# Patient Record
Sex: Male | Born: 1955 | Race: White | Hispanic: No | Marital: Single | Smoking: Former smoker
Health system: Southern US, Community
[De-identification: ages and names within clinical notes are randomized; demographics above are authoritative.]

## PROBLEM LIST (undated history)

## (undated) ENCOUNTER — Ambulatory Visit: Disposition: A | Discharge status: Home / Self Care | Payer: Self-pay

## (undated) DIAGNOSIS — C140 Malignant neoplasm of pharynx, unspecified: Secondary | ICD-10-CM

## (undated) DIAGNOSIS — C801 Malignant (primary) neoplasm, unspecified: Secondary | ICD-10-CM

## (undated) DIAGNOSIS — K219 Gastro-esophageal reflux disease without esophagitis: Secondary | ICD-10-CM

## (undated) DIAGNOSIS — I10 Essential (primary) hypertension: Secondary | ICD-10-CM

## (undated) DIAGNOSIS — F419 Anxiety disorder, unspecified: Secondary | ICD-10-CM

## (undated) DIAGNOSIS — J45909 Unspecified asthma, uncomplicated: Secondary | ICD-10-CM

## (undated) HISTORY — DX: Gastro-esophageal reflux disease without esophagitis: K21.9

## (undated) HISTORY — DX: Anxiety disorder, unspecified: F41.9

## (undated) HISTORY — DX: Essential (primary) hypertension: I10

## (undated) HISTORY — DX: Unspecified asthma, uncomplicated: J45.909

---

## 2005-10-12 ENCOUNTER — Other Ambulatory Visit: Payer: Self-pay

## 2005-10-12 ENCOUNTER — Inpatient Hospital Stay: Payer: Self-pay | Admitting: Internal Medicine

## 2005-10-15 ENCOUNTER — Emergency Department: Payer: Self-pay | Admitting: General Practice

## 2007-05-27 ENCOUNTER — Other Ambulatory Visit: Payer: Self-pay

## 2007-05-28 ENCOUNTER — Observation Stay: Payer: Self-pay | Admitting: Internal Medicine

## 2007-05-31 ENCOUNTER — Ambulatory Visit: Payer: Self-pay | Admitting: Internal Medicine

## 2007-06-02 ENCOUNTER — Inpatient Hospital Stay: Payer: Self-pay | Admitting: Internal Medicine

## 2007-06-02 ENCOUNTER — Other Ambulatory Visit: Payer: Self-pay

## 2007-06-13 ENCOUNTER — Other Ambulatory Visit: Payer: Self-pay

## 2007-06-14 ENCOUNTER — Inpatient Hospital Stay: Payer: Self-pay | Admitting: Internal Medicine

## 2007-06-14 ENCOUNTER — Other Ambulatory Visit: Payer: Self-pay

## 2008-09-09 ENCOUNTER — Emergency Department: Payer: Self-pay | Admitting: Emergency Medicine
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
## 2009-01-29 ENCOUNTER — Emergency Department: Payer: Self-pay | Admitting: Unknown Physician Specialty

## 2009-09-20 ENCOUNTER — Emergency Department: Payer: Self-pay | Admitting: Internal Medicine

## 2010-04-18 ENCOUNTER — Emergency Department: Payer: Self-pay | Admitting: Emergency Medicine

## 2010-11-10 ENCOUNTER — Emergency Department: Payer: Self-pay | Admitting: Emergency Medicine

## 2011-05-06 ENCOUNTER — Emergency Department: Payer: Self-pay | Admitting: Internal Medicine

## 2011-05-06 LAB — COMPREHENSIVE METABOLIC PANEL
Alkaline Phosphatase: 65 U/L (ref 50–136)
Anion Gap: 7 (ref 7–16)
Bilirubin,Total: 0.4 mg/dL (ref 0.2–1.0)
Calcium, Total: 8.4 mg/dL — ABNORMAL LOW (ref 8.5–10.1)
Chloride: 105 mmol/L (ref 98–107)
Co2: 30 mmol/L (ref 21–32)
Creatinine: 0.89 mg/dL (ref 0.60–1.30)
EGFR (African American): >60
EGFR (Non-African Amer.): >60
Glucose: 103 mg/dL — ABNORMAL HIGH (ref 65–99)
Potassium: 4 mmol/L (ref 3.5–5.1)
SGPT (ALT): 35 U/L
Total Protein: 7.4 g/dL (ref 6.4–8.2)

## 2011-05-06 LAB — URINALYSIS, COMPLETE
Bacteria: NONE SEEN
Bilirubin,UR: NEGATIVE
Glucose,UR: NEGATIVE mg/dL (ref 0–75)
Leukocyte Esterase: NEGATIVE
Nitrite: NEGATIVE
RBC,UR: 1 /HPF (ref 0–5)
Specific Gravity: 1.023 (ref 1.003–1.030)
Squamous Epithelial: <1

## 2011-05-06 LAB — CBC
HGB: 16 g/dL (ref 13.0–18.0)
MCH: 32.1 pg (ref 26.0–34.0)
MCHC: 34.1 g/dL (ref 32.0–36.0)
MCV: 94 fL (ref 80–100)
Platelet: 188 10*3/uL (ref 150–440)
RBC: 5 10*6/uL (ref 4.40–5.90)

## 2011-05-06 LAB — DRUG SCREEN, URINE
Barbiturates, Ur Screen: NEGATIVE (ref ?–200)
Benzodiazepine, Ur Scrn: NEGATIVE (ref ?–200)
Cannabinoid 50 Ng, Ur ~~LOC~~: NEGATIVE (ref ?–50)

## 2011-05-06 LAB — RAPID INFLUENZA A&B ANTIGENS

## 2011-05-06 LAB — CK TOTAL AND CKMB (NOT AT ARMC): CK-MB: <0.5 ng/mL — ABNORMAL LOW (ref 0.5–3.6)

## 2011-05-06 LAB — TROPONIN I: Troponin-I: <0.02 ng/mL

## 2011-05-06 LAB — LIPASE, BLOOD: Lipase: 119 U/L (ref 73–393)

## 2011-05-12 LAB — CULTURE, BLOOD (SINGLE)

## 2011-07-22 DIAGNOSIS — K219 Gastro-esophageal reflux disease without esophagitis: Secondary | ICD-10-CM | POA: Insufficient documentation

## 2011-07-22 DIAGNOSIS — I10 Essential (primary) hypertension: Secondary | ICD-10-CM | POA: Diagnosis present

## 2011-10-05 ENCOUNTER — Emergency Department: Payer: Self-pay | Admitting: Unknown Physician Specialty

## 2011-10-05 LAB — CBC
HCT: 45.6 % (ref 40.0–52.0)
MCH: 31.7 pg (ref 26.0–34.0)
MCHC: 33.4 g/dL (ref 32.0–36.0)
Platelet: 190 10*3/uL (ref 150–440)
RDW: 13.1 % (ref 11.5–14.5)

## 2011-10-05 LAB — COMPREHENSIVE METABOLIC PANEL
Albumin: 3.7 g/dL (ref 3.4–5.0)
BUN: 16 mg/dL (ref 7–18)
Bilirubin,Total: 0.5 mg/dL (ref 0.2–1.0)
Chloride: 110 mmol/L — ABNORMAL HIGH (ref 98–107)
Co2: 26 mmol/L (ref 21–32)
Creatinine: 0.93 mg/dL (ref 0.60–1.30)
EGFR (Non-African Amer.): >60
Glucose: 112 mg/dL — ABNORMAL HIGH (ref 65–99)
Osmolality: 287 (ref 275–301)
Potassium: 3.7 mmol/L (ref 3.5–5.1)
SGPT (ALT): 32 U/L
Sodium: 143 mmol/L (ref 136–145)

## 2011-10-05 LAB — CK TOTAL AND CKMB (NOT AT ARMC): CK, Total: 90 U/L (ref 35–232)

## 2011-10-05 LAB — MAGNESIUM: Magnesium: 2 mg/dL

## 2011-10-13 DIAGNOSIS — I2510 Atherosclerotic heart disease of native coronary artery without angina pectoris: Secondary | ICD-10-CM | POA: Diagnosis present

## 2011-11-29 ENCOUNTER — Emergency Department: Payer: Self-pay | Admitting: Emergency Medicine

## 2011-11-29 LAB — TROPONIN I: Troponin-I: <0.02 ng/mL

## 2011-11-29 LAB — BASIC METABOLIC PANEL
Anion Gap: 7 (ref 7–16)
BUN: 14 mg/dL (ref 7–18)
Calcium, Total: 8 mg/dL — ABNORMAL LOW (ref 8.5–10.1)
Chloride: 110 mmol/L — ABNORMAL HIGH (ref 98–107)
Co2: 27 mmol/L (ref 21–32)
Creatinine: 0.94 mg/dL (ref 0.60–1.30)
Osmolality: 289 (ref 275–301)
Potassium: 3.7 mmol/L (ref 3.5–5.1)

## 2011-11-29 LAB — CBC
HCT: 43.2 % (ref 40.0–52.0)
MCHC: 35.4 g/dL (ref 32.0–36.0)
MCV: 93 fL (ref 80–100)
Platelet: 188 10*3/uL (ref 150–440)
RBC: 4.65 10*6/uL (ref 4.40–5.90)
RDW: 13.3 % (ref 11.5–14.5)
WBC: 5.9 10*3/uL (ref 3.8–10.6)

## 2011-11-29 LAB — CK TOTAL AND CKMB (NOT AT ARMC): CK, Total: 232 U/L (ref 35–232)

## 2012-09-08 ENCOUNTER — Emergency Department: Payer: Self-pay | Admitting: Unknown Physician Specialty

## 2012-09-08 LAB — COMPREHENSIVE METABOLIC PANEL
Albumin: 4 g/dL (ref 3.4–5.0)
Alkaline Phosphatase: 111 U/L (ref 50–136)
Bilirubin,Total: 0.4 mg/dL (ref 0.2–1.0)
Calcium, Total: 9 mg/dL (ref 8.5–10.1)
Co2: 31 mmol/L (ref 21–32)
EGFR (African American): >60
EGFR (Non-African Amer.): >60
Osmolality: 279 (ref 275–301)
Potassium: 4.1 mmol/L (ref 3.5–5.1)
SGPT (ALT): 48 U/L (ref 12–78)
Sodium: 140 mmol/L (ref 136–145)
Total Protein: 8.3 g/dL — ABNORMAL HIGH (ref 6.4–8.2)

## 2012-09-08 LAB — CBC
HGB: 17.6 g/dL (ref 13.0–18.0)
MCH: 32.7 pg (ref 26.0–34.0)
MCHC: 35.3 g/dL (ref 32.0–36.0)
MCV: 93 fL (ref 80–100)
Platelet: 212 10*3/uL (ref 150–440)
RBC: 5.37 10*6/uL (ref 4.40–5.90)
RDW: 13.6 % (ref 11.5–14.5)

## 2012-09-08 LAB — TROPONIN I: Troponin-I: <0.02 ng/mL

## 2012-09-08 LAB — MAGNESIUM: Magnesium: 2.1 mg/dL

## 2012-11-28 ENCOUNTER — Emergency Department: Payer: Self-pay | Admitting: Emergency Medicine

## 2012-12-07 ENCOUNTER — Emergency Department: Payer: Self-pay | Admitting: Emergency Medicine

## 2012-12-07 LAB — COMPREHENSIVE METABOLIC PANEL
Albumin: 3.7 g/dL (ref 3.4–5.0)
Alkaline Phosphatase: 102 U/L (ref 50–136)
Anion Gap: 4 — ABNORMAL LOW (ref 7–16)
BUN: 19 mg/dL — ABNORMAL HIGH (ref 7–18)
Bilirubin,Total: 0.2 mg/dL (ref 0.2–1.0)
Calcium, Total: 9 mg/dL (ref 8.5–10.1)
Chloride: 106 mmol/L (ref 98–107)
Co2: 29 mmol/L (ref 21–32)
Creatinine: 0.98 mg/dL (ref 0.60–1.30)
EGFR (Non-African Amer.): >60
Glucose: 115 mg/dL — ABNORMAL HIGH (ref 65–99)
Osmolality: 281 (ref 275–301)
Potassium: 4 mmol/L (ref 3.5–5.1)
SGOT(AST): 30 U/L (ref 15–37)
Sodium: 139 mmol/L (ref 136–145)

## 2012-12-07 LAB — CBC
HCT: 43.6 % (ref 40.0–52.0)
HGB: 15.3 g/dL (ref 13.0–18.0)
MCH: 32.2 pg (ref 26.0–34.0)
RBC: 4.77 10*6/uL (ref 4.40–5.90)
RDW: 12.9 % (ref 11.5–14.5)

## 2012-12-07 LAB — CK TOTAL AND CKMB (NOT AT ARMC): CK, Total: 162 U/L (ref 35–232)

## 2012-12-07 LAB — TROPONIN I: Troponin-I: <0.02 ng/mL

## 2012-12-08 LAB — TROPONIN I: Troponin-I: <0.02 ng/mL

## 2012-12-08 LAB — URINALYSIS, COMPLETE
Bilirubin,UR: NEGATIVE
Blood: NEGATIVE
Glucose,UR: NEGATIVE mg/dL (ref 0–75)
Ketone: NEGATIVE
Leukocyte Esterase: NEGATIVE
Protein: NEGATIVE
RBC,UR: NONE SEEN /HPF (ref 0–5)
Specific Gravity: 1.025 (ref 1.003–1.030)
Squamous Epithelial: NONE SEEN
WBC UR: <1 /HPF (ref 0–5)

## 2013-03-02 ENCOUNTER — Emergency Department: Payer: Self-pay | Admitting: Emergency Medicine

## 2013-03-07 DIAGNOSIS — E119 Type 2 diabetes mellitus without complications: Secondary | ICD-10-CM

## 2013-06-05 ENCOUNTER — Observation Stay: Payer: Self-pay | Admitting: Internal Medicine

## 2013-06-05 LAB — BASIC METABOLIC PANEL
BUN: 15 mg/dL (ref 7–18)
CALCIUM: 8.4 mg/dL — AB (ref 8.5–10.1)
Chloride: 111 mmol/L — ABNORMAL HIGH (ref 98–107)
Co2: 33 mmol/L — ABNORMAL HIGH (ref 21–32)
Creatinine: 1.01 mg/dL (ref 0.60–1.30)
EGFR (African American): >60
EGFR (Non-African Amer.): >60
GLUCOSE: 110 mg/dL — AB (ref 65–99)
Osmolality: 279 (ref 275–301)
Potassium: 3.6 mmol/L (ref 3.5–5.1)
Sodium: 139 mmol/L (ref 136–145)

## 2013-06-05 LAB — CBC
HCT: 44.5 % (ref 40.0–52.0)
HGB: 15.2 g/dL (ref 13.0–18.0)
MCH: 32.1 pg (ref 26.0–34.0)
MCHC: 34.2 g/dL (ref 32.0–36.0)
MCV: 94 fL (ref 80–100)
PLATELETS: 191 10*3/uL (ref 150–440)
RBC: 4.74 10*6/uL (ref 4.40–5.90)
RDW: 13.4 % (ref 11.5–14.5)
WBC: 8.8 10*3/uL (ref 3.8–10.6)

## 2013-06-05 LAB — CK TOTAL AND CKMB (NOT AT ARMC)
CK, Total: 375 U/L — ABNORMAL HIGH
CK, Total: 295 U/L
CK, Total: 260 U/L
CK-MB: 5.7 ng/mL — AB (ref 0.5–3.6)
CK-MB: 4.4 ng/mL — ABNORMAL HIGH (ref 0.5–3.6)
CK-MB: 4.5 ng/mL — ABNORMAL HIGH (ref 0.5–3.6)

## 2013-06-05 LAB — PROTIME-INR
INR: 1
Prothrombin Time: 13 secs (ref 11.5–14.7)

## 2013-06-05 LAB — PRO B NATRIURETIC PEPTIDE: B-TYPE NATIURETIC PEPTID: 80 pg/mL (ref 0–125)

## 2013-06-05 LAB — TROPONIN I: Troponin-I: <0.02 ng/mL

## 2013-06-06 LAB — LIPID PANEL
Cholesterol: 145 mg/dL (ref 0–200)
HDL Cholesterol: 28 mg/dL — ABNORMAL LOW (ref 40–60)
Ldl Cholesterol, Calc: 65 mg/dL (ref 0–100)
Triglycerides: 262 mg/dL — ABNORMAL HIGH (ref 0–200)
VLDL Cholesterol, Calc: 52 mg/dL — ABNORMAL HIGH (ref 5–40)

## 2013-06-06 LAB — HEMOGLOBIN A1C: Hemoglobin A1C: 5.5 % (ref 4.2–6.3)

## 2013-08-26 DIAGNOSIS — E041 Nontoxic single thyroid nodule: Secondary | ICD-10-CM | POA: Insufficient documentation

## 2014-08-14 NOTE — Consult Note (Signed)
PATIENT NAME:  Carl Waters, Carl Waters MR#:  [id number] DATE OF BIRTH:  [birth year]  DATE OF CONSULTATION:  11/29/2011  REFERRING PHYSICIAN:  Daryel November, MD  CONSULTING PHYSICIAN:  Krystal Eaton, MD  PRIMARY CARE PHYSICIAN: Bernestine Amass   PRIMARY CARDIOLOGIST: [facility]    CHIEF COMPLAINT: Abdominal pain and chest pain.   HISTORY OF PRESENT ILLNESS: The patient is a pleasant 59 year old Caucasian male with history of coronary artery disease status post cath and stenting to left anterior descending lesion in 2009 as well as some RCA lesions, history of tobacco abuse, hypertension, and hyperlipidemia who was at church this morning and experienced chest pain in the left side of the chest with some abdominal pain. The pain in the chest was nonradiating. He did experience some diaphoresis and weakness without any dizziness or visual changes. He also experienced some abdominal pain at the same time more in the epigastric area. He is not having any fevers, chills, nausea, or vomiting. He denies having any cough or shortness of breath. Here in the ER he has a negative troponin as well as a negative CK-MB. Of note, the patient also experienced another episode of similar symptoms about a month ago he states. At that time his cardiologist took him to the Cath Lab at [facility] and he states "they said I was fine". While in church, EMS was called and while in the ambulance he did have two nitroglycerin sublingual. At this point he does not have any chest pain. Hospitalist service was contacted for further evaluation.   The patient also has been noncompliant with his medications. He says he ran out of his medications for about 3 to 4 days. He tried to [employer] as he has no source of income or drop to pay for his medications, however, has been unsuccessful in getting his medications. He states he is on aspirin and nitroglycerin sublingual as well as a couple of blood pressure medicines and possibly  simvastatin.   PAST MEDICAL HISTORY:  1. Coronary artery disease status post stenting as above with EF of 60% per chart in 2009. 2. History of alcohol abuse, now occasional alcohol.  3. History of tobacco abuse, currently has stopped as of 2009.  4. Hypertension.  5. Hyperlipidemia.   ALLERGIES: No known drug allergies.   MEDICATIONS:  1. Aspirin 81 mg daily. 2. Nitroglycerin sublingual p.r.n.   The rest of his medications he does not know the dosages or the names and we could not call the pharmacy as the pharmacy was closed. However, he states that he takes a couple of blood pressure medications and a cholesterol medicine.   SOCIAL HISTORY: Previous tobacco abuser, currently quit as of 2009. Occasional alcohol. No drug use.   FAMILY HISTORY: Dad with history of what he describes as could be possibly an aneurysm. Cancer in multiple family members.   REVIEW OF SYSTEMS: CONSTITUTIONAL: No fever or fatigue. Some global weakness. No weight changes. EYES: No blurry vision or double vision. Has right eye subconjunctival hemorrhage which is being treated as an outpatient. ENT: No tinnitus or hearing loss. RESPIRATORY: No cough. Has some chronic shortness of breath. No asthma or COPD. No painful respiration. CARDIOVASCULAR: Chest pain as above. No orthopnea or edema. Has history of high blood pressure. No palpitations or syncope. GI: No nausea, vomiting, or diarrhea. Denies constipation. Denies hematemesis or melena. Has abdominal pain in the epigastric area. GU: Denies dysuria, incontinence, or frequency. ENDOCRINE: Denies polyuria or nocturia. HEME/LYMPH: Denies anemia  or easy bruising. SKIN: Denies any new rashes. MUSCULOSKELETAL: Denies arthritis or gout. NEUROLOGIC: Denies any focal weakness or CVA. PSYCHIATRIC: Denies depression or anxiety.   PHYSICAL EXAMINATION:   VITAL SIGNS: Temperature on arrival 98.2, pulse rate 88, respiratory rate 18, initial blood pressure 136/73, last one 102/78,  oxygen sat 97% on room air.   GENERAL: The patient is a pleasant Caucasian male sitting in bed in no obvious distress talking in full sentences.   HEENT: Normocephalic, atraumatic. Pupils are equal and reactive. There is right conjunctival hemorrhage. Extraocular muscles intact. Moist mucous membranes.   NECK: Supple. No thyroid tenderness. No JVD.   CARDIOVASCULAR: S1, S2 regular rate and rhythm. No murmurs, rubs, or gallops.   LUNGS: Clear to auscultation bilaterally without wheezing or rhonchi.   ABDOMEN: Soft, nondistended. Mild epigastric tenderness. No rebound or guarding.   EXTREMITIES: No significant lower extremity edema.   SKIN: No obvious rashes.   NEUROLOGIC: Cranial nerves II through XII grossly intact. Strength is 5/5 in all extremities.   PSYCH: Awake, alert, oriented x3, pleasant and cooperative.   LABORATORY, DIAGNOSTIC, AND RADIOLOGICAL DATA: Glucose 128, creatinine 0.94, BUN 14, sodium 144, potassium 3.7. CK-MB 2.1. Troponin negative. CK total 232. WBC 5.9, hemoglobin 15.3, hematocrit 43.2.   X-ray of the abdomen, 3-way including chest PA, showing mild bibasilar opacity likely secondary to atelectasis. No free intraperitoneal air. Stool is seen with nondilated colon. There is relative paucity of small bowel gas limiting evaluation but no evidence of dilated loops of small bowel.   EKG normal sinus rhythm, rate is 87. No acute ST elevations or depression. There are nonspecific T wave changes.   ASSESSMENT AND PLAN: We have a 59 year old Caucasian male with hypertension, hyperlipidemia, coronary artery disease status post stenting back in 2009 who has also apparently had another cath about a month ago at [facility] which he states they told him that he did not need any changes to his medications and was told "he was fine". He presents with another episode of chest pain and abdominal pain. At this point he has a negative troponin and has no further chest pains. EKG did not show  any acute ST elevations or depressions but has some nonspecific T wave changes but given the fact that he just had a cardiac cath a month ago at [facility] I would think that this is likely not cardiac at this point. He's been noncompliant with his medications, which I discussed with him the importance of compliance. He is to continue taking his medications as an outpatient and we have discussed the case with our case management here who will provide some prescriptions for him until he sees his primary care physician at [facility].   In regards to his abdominal pain, upon further conversation with him he does appear to have GERD-related symptoms such as burning in his esophagus and throat when he lays flat, some bloating. I would recommend starting a proton pump inhibitor or an antacid, whatever he can afford, until he sees his primary care physician. There is also some stool seen in the x-ray and he might benefit from being on some stool softener. In regards to his chest pain, I would advise follow-up with his primary cardiologist and being compliant with his medications. Would not recommend any further cardiac work-up at this point given the recent cardiac cath at [facility].   TOTAL TIME SPENT: 50 minutes.   ____________________________ Krystal Eaton, MD sa:drc D: 11/29/2011 15:20:05 ET T: 11/30/2011 08:45:36 ET JOB#:  [id number]  cc: Krystal EatonShayiq Aarianna Hoadley, MD, <Dictator> Krystal EatonSHAYIQ Adith Tejada MD ELECTRONICALLY SIGNED 12/08/2011 15:18

## 2014-08-18 NOTE — Discharge Summary (Signed)
PATIENT NAME:  Carl Waters, Carl Waters MR#:  [id number] DATE OF BIRTH:  05/16/1955  DATE OF ADMISSION:  06/05/2013 DATE OF DISCHARGE:  06/06/2013  PRESENTING COMPLAINT: Chest pain and right shoulder pain.   DISCHARGE DIAGNOSES: 1.  Chest pain, appears atypical.  2.  Suspected right frozen shoulder.  3.  Coronary artery disease.  4.  Hypertension. Myoview stress test negative.   CODE STATUS: Full code.   MEDICATIONS: 1.  Aspirin 81 mg daily.  2.  Nitroglycerin 0.4 mg sublingual as needed.  3.  Amitriptyline 25 mg at bedtime.  4.  Cyclobenzaprine 10 mg every 8 hours as needed.  5.  Prilosec 20 mg p.o. daily.  6.  Lipitor 40 mg daily.   FOLLOWUP: [facility] in 2 to 4 weeks.   Cardiac enzymes x 3 negative. Myoview stress test within normal limits. Hemoglobin A1c is 5.5. CBC within normal limits.   BRIEF SUMMARY OF HOSPITAL COURSE: Joselyn ArrowJoseph Waters is a 59 year old, very pleasant Caucasian gentleman with a history of CAD. Comes to the Emergency Room after he started having some chest pain. He was admitted with:  1.  Chest pain, which appeared atypical: He was ruled out with 3 sets of cardiac enzymes and Myoview stress which was negative. He was continued on aspirin, statins, and lisinopril. Did not have any further chest pains and EKG did not show any changes.  2.  Suspected right frozen shoulder: The patient was asked to Tylenol or Advil. Exercises were explained to the patient, and a printout of exercises for frozen shoulder was given to the patient.  3.  Hypertension: Lisinopril was continued.  4.  CAD: The patient is on aspirin.   Overall hospital stay remained stable. The patient remained a full code.   TIME SPENT: 40 minutes.    ____________________________ Wylie HailSona A. Allena KatzPatel, MD sap:jcm D: 06/06/2013 17:59:04 ET T: 06/06/2013 20:41:41 ET JOB#: [id number]  cc: Abed Schar A. Allena KatzPatel, MD, <Dictator> [facility] Willow OraSONA A Torian Quintero MD ELECTRONICALLY SIGNED 06/08/2013 13:26

## 2014-08-18 NOTE — H&P (Signed)
PATIENT NAME:  Carl Waters, Carl Waters MR#:  [id number] DATE OF BIRTH:  Apr 29, 1955  DATE OF ADMISSION:  06/05/2013  PRIMARY CARE PHYSICIAN: None.   EMERGENCY ROOM PHYSICIAN: Dr. Ethelda ChickJacubowitz.    CHIEF COMPLAINT: Chest pain.   HISTORY OF PRESENT ILLNESS: The patient is a 59 year old male with coronary artery disease with a bare metal stent in February of 2009. He used to follow with Dr. Juliann Paresallwood, now not following any more, comes in because of chest pain, started yesterday afternoon, in the middle of the chest, radiating to the right arm, associated with mild trouble breathing, relieved with nitroglycerin given in the ER. The patient did not have any radiation of chest pain to back  or to the left arm. Did not have nausea or vomiting and no sweating. The patient also has some cough with no phlegm. No fever. No orthopnea. No PND. No pedal edema. Because of chest pain and history of coronary artery disease and stent placement, we were asked to admit the patient. The patient, right now, chest pain free and having headache.   PAST MEDICAL HISTORY:  Significant for history of coronary artery disease with bare metal stent placed in 2009. Cardiac cath at that time revealed 99% stenosis in the LAD. The patient's EF was 60%. He also has a history of hypertension, hyperlipidemia.   PAST SURGICAL HISTORY: Significant for appendectomy, hernia repair.   SOCIAL HISTORY: Previous smoker, quit 2 years ago; used to smoke 1 pack for about 5 years  on and off. Alcohol:  Also used to drink heavily before; quit 2 years ago, and previous IV drug use and now quit. The patient right now is not working, unemployed, lives alone.   ALLERGIES: No known allergies.   FAMILY HISTORY: Significant for coronary artery disease on the mother's side. Uncle also had bypass surgery on the mother's side.   MEDICATIONS:  Aspirin 81 mg daily, Flexeril 10 mg every 8 hours as needed, Lipitor 40 mg p.o. daily, lisinopril 20 mg p.o. daily, Prilosec 20  mg daily, nitroglycerin sublingual 0.4 mg every 5 minutes as needed for chest pain.    REVIEW OF SYSTEMS:  CONSTITUTIONAL:  He has no fever. No fatigue.  EYES: No blurred vision.  ENT: No tinnitus. No ear pain. No epistaxis. No difficulty swallowing.  RESPIRATORY: Has some cough. No wheezing. The patient has no COPD.  CARDIOVASCULAR: Has chest pain since yesterday. No orthopnea. No PND. No palpitations.  GASTROINTESTINAL: No nausea. No vomiting. No abdominal pain.  GENITOURINARY: No dysuria.  ENDOCRINE: No polyuria or nocturia.  HEMATOLOGIC: No anemia.  MUSCULOSKELETAL: The patient is on Flexeril for muscle spasms.  NEUROLOGIC: No numbness or weakness.  PSYCHIATRIC: No anxiety.   PHYSICAL EXAMINATION: VITAL SIGNS: Temperature 98.9, heart rate 87, blood pressure 106/79, sats 97% on room air.  GENERAL: Alert, awake, oriented, not in distress, answering questions appropriately.  HEENT:  Head: Normocephalic, atraumatic. Eyes: Pupils equally reacting to light. No conjunctival pallor. No scleral icterus. Extraocular movements are intact. Nose: No nasal lesions. No drainage. Ears: No drainage. No external lesions. Mouth: No lesions. No exudates.   NECK: Supple. No JVD. No masses. Thyroid is in the midline.  RESPIRATORY: Good respiratory effort. Clear to auscultation.  CARDIOVASCULAR: Regular rate and rhythm. No murmurs. No gallops. No costochondral tenderness. Femoral and pedal pulses are intact. The patient has no peripheral edema.  GASTROINTESTINAL: Abdomen is soft, nontender, nondistended. Bowel sounds present in all quadrants.  MUSCULOSKELETAL: The patient able to move extremities x 4. No  tenderness or effusion. The patient's strength and tone are equal bilaterally.  SKIN: Inspection is normal, well hydrated.  LYMPHATICS:  No cervical lymphadenopathy.  NEUROLOGIC: Cranial nerves II through XII intact. DTRs 2+ bilaterally, upper and lower extremities. The patient has motor strength 4/5 upper  and lower extremities. Sensation is intact.  PSYCHIATRIC: Mood and affect are within normal limits.   DIAGNOSTIC, RADIOLOGIC AND LABORATORY DATA:  INR is 1. BNP is 80. Electrolytes: Sodium is 139, potassium 3.6, chloride 111, bicarb 33, BUN 15, creatinine 1, glucose 110.   Troponin less than 0.02. Chest x-ray shows no active disease.   EKG: Normal sinus rhythm, 86 beats minutes. No ST-T changes.   ASSESSMENT AND PLAN: The patient is a 59 year old male patient with chest pain. It sounds atypical but because of his coronary artery disease, admit him to observation. Continue aspirin, beta blockers, statins, ACE inhibitors and nitroglycerin, and continue to cycle troponins 2 more times every 4 hours. If they are negative, the patient can have a stress test, and if stress test is negative, he will be discharged. If stress test is positive, he needs cardiology consult.  2.  Continue gastrointestinal prophylaxis and deep vein thrombosis prophylaxis.   TIME SPENT: About 55 minutes on this admission.    ____________________________ Katha Hamming, MD sk:dmm D: 06/05/2013 10:57:51 ET T: 06/05/2013 11:34:21 ET JOB#: [id number]  cc: Katha Hamming, MD, <Dictator> Katha Hamming MD ELECTRONICALLY SIGNED 06/06/2013 17:55

## 2014-10-31 DIAGNOSIS — J4489 Other specified chronic obstructive pulmonary disease: Secondary | ICD-10-CM | POA: Diagnosis present

## 2015-03-09 DIAGNOSIS — G8929 Other chronic pain: Secondary | ICD-10-CM | POA: Insufficient documentation

## 2016-07-20 DIAGNOSIS — G4700 Insomnia, unspecified: Secondary | ICD-10-CM | POA: Insufficient documentation

## 2016-07-20 DIAGNOSIS — F411 Generalized anxiety disorder: Secondary | ICD-10-CM | POA: Insufficient documentation

## 2016-08-04 DIAGNOSIS — I6521 Occlusion and stenosis of right carotid artery: Secondary | ICD-10-CM | POA: Insufficient documentation

## 2016-08-04 DIAGNOSIS — H3411 Central retinal artery occlusion, right eye: Secondary | ICD-10-CM | POA: Insufficient documentation

## 2017-02-10 DIAGNOSIS — I6601 Occlusion and stenosis of right middle cerebral artery: Secondary | ICD-10-CM | POA: Insufficient documentation

## 2017-02-10 DIAGNOSIS — F1910 Other psychoactive substance abuse, uncomplicated: Secondary | ICD-10-CM | POA: Insufficient documentation

## 2019-06-06 DIAGNOSIS — H6991 Unspecified Eustachian tube disorder, right ear: Secondary | ICD-10-CM | POA: Insufficient documentation

## 2021-03-05 ENCOUNTER — Other Ambulatory Visit: Payer: Self-pay | Admitting: Orthopaedic Surgery

## 2021-03-05 DIAGNOSIS — M25511 Pain in right shoulder: Secondary | ICD-10-CM

## 2021-03-19 ENCOUNTER — Ambulatory Visit
Admission: RE | Admit: 2021-03-19 | Discharge: 2021-03-19 | Disposition: A | Discharge status: Home / Self Care | Payer: Medicare Other | Source: Ambulatory Visit | Attending: Orthopaedic Surgery | Admitting: Orthopaedic Surgery

## 2021-03-19 ENCOUNTER — Other Ambulatory Visit: Payer: Self-pay

## 2021-03-19 DIAGNOSIS — Z96611 Presence of right artificial shoulder joint: Secondary | ICD-10-CM | POA: Insufficient documentation

## 2021-03-19 DIAGNOSIS — M25511 Pain in right shoulder: Secondary | ICD-10-CM | POA: Insufficient documentation

## 2021-03-19 MED ORDER — SODIUM CHLORIDE (PF) 0.9 % IJ SOLN
10.0000 mL | INTRAMUSCULAR | Status: DC | PRN
  Administered 2021-03-19: 5 mL via INTRAVENOUS

## 2021-03-19 MED ORDER — LIDOCAINE HCL (PF) 1 % IJ SOLN
10.0000 mL | Freq: Once | INTRAMUSCULAR | Status: AC
  Administered 2021-03-19: 10 mL
  Filled 2021-03-19: qty 10

## 2021-03-19 MED ORDER — IOHEXOL 180 MG/ML  SOLN
20.0000 mL | Freq: Once | INTRAMUSCULAR | Status: AC | PRN
  Administered 2021-03-19: 15 mL

## 2022-08-21 DIAGNOSIS — K859 Acute pancreatitis without necrosis or infection, unspecified: Secondary | ICD-10-CM | POA: Insufficient documentation

## 2022-12-22 DIAGNOSIS — F1411 Cocaine abuse, in remission: Secondary | ICD-10-CM | POA: Insufficient documentation

## 2023-01-29 DIAGNOSIS — Q2579 Other congenital malformations of pulmonary artery: Secondary | ICD-10-CM | POA: Insufficient documentation

## 2023-03-09 ENCOUNTER — Other Ambulatory Visit: Payer: Self-pay | Admitting: Otolaryngology

## 2023-03-09 DIAGNOSIS — C109 Malignant neoplasm of oropharynx, unspecified: Secondary | ICD-10-CM

## 2023-03-09 DIAGNOSIS — C098 Malignant neoplasm of overlapping sites of tonsil: Secondary | ICD-10-CM

## 2023-03-16 ENCOUNTER — Ambulatory Visit
Admission: RE | Admit: 2023-03-16 | Discharge: 2023-03-16 | Disposition: A | Discharge status: Home / Self Care | Payer: Medicare Other | Source: Ambulatory Visit | Attending: Otolaryngology | Admitting: Otolaryngology

## 2023-03-16 DIAGNOSIS — C098 Malignant neoplasm of overlapping sites of tonsil: Secondary | ICD-10-CM | POA: Diagnosis not present

## 2023-03-16 DIAGNOSIS — M25411 Effusion, right shoulder: Secondary | ICD-10-CM | POA: Diagnosis not present

## 2023-03-16 DIAGNOSIS — I700 Atherosclerosis of aorta: Secondary | ICD-10-CM | POA: Diagnosis not present

## 2023-03-16 DIAGNOSIS — I2510 Atherosclerotic heart disease of native coronary artery without angina pectoris: Secondary | ICD-10-CM | POA: Diagnosis not present

## 2023-03-16 DIAGNOSIS — C109 Malignant neoplasm of oropharynx, unspecified: Secondary | ICD-10-CM | POA: Diagnosis present

## 2023-03-16 LAB — GLUCOSE, CAPILLARY: Glucose-Capillary: 127 mg/dL — ABNORMAL HIGH (ref 70–99)

## 2023-03-16 MED ORDER — FLUDEOXYGLUCOSE F - 18 (FDG) INJECTION
10.1000 | Freq: Once | INTRAVENOUS | Status: AC | PRN
  Administered 2023-03-16: 11.43 via INTRAVENOUS

## 2023-03-18 ENCOUNTER — Institutional Professional Consult (permissible substitution): Payer: Medicare Other | Admitting: Radiation Oncology

## 2023-03-31 ENCOUNTER — Inpatient Hospital Stay: Payer: Medicare Other

## 2023-03-31 ENCOUNTER — Encounter: Payer: Self-pay | Admitting: Oncology

## 2023-03-31 ENCOUNTER — Other Ambulatory Visit: Payer: Self-pay | Admitting: Radiation Oncology

## 2023-03-31 ENCOUNTER — Ambulatory Visit
Admission: RE | Admit: 2023-03-31 | Discharge: 2023-03-31 | Disposition: A | Discharge status: Home / Self Care | Payer: Medicare Other | Source: Ambulatory Visit | Attending: Radiation Oncology | Admitting: Radiation Oncology

## 2023-03-31 ENCOUNTER — Inpatient Hospital Stay: Payer: Medicare Other | Attending: Oncology | Admitting: Oncology

## 2023-03-31 VITALS — BP 129/88 | HR 79 | Temp 96.5°F | Resp 18 | Ht 72.0 in | Wt 197.3 lb

## 2023-03-31 DIAGNOSIS — C109 Malignant neoplasm of oropharynx, unspecified: Secondary | ICD-10-CM | POA: Diagnosis present

## 2023-03-31 DIAGNOSIS — Z87891 Personal history of nicotine dependence: Secondary | ICD-10-CM | POA: Diagnosis not present

## 2023-03-31 DIAGNOSIS — C801 Malignant (primary) neoplasm, unspecified: Secondary | ICD-10-CM

## 2023-03-31 DIAGNOSIS — Z7189 Other specified counseling: Secondary | ICD-10-CM | POA: Insufficient documentation
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
## 2023-03-31 NOTE — Consult Note (Signed)
NEW PATIENT EVALUATION  Name: Carl Waters  MRN: [id number]  Date:   03/31/2023     DOB: 1955-09-16   This 67 y.o. male patient presents to the clinic for initial evaluation of stage III (C T2.  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   PREVIOUS INVESTIGATIONS:  PET scan CT scans reviewed Clinical notes reviewed Pathology report reviewed  HPI: Patient is a 67 year old male who presented with a left-sided toothache for several months.  He eventually was noted to have a mass in his left submandibular region fixed.  Imaging studies showed a left tonsillar mass and possible adenopathy in the neck.  PET CT scan was performed showing a 2.5 x 2.9 cm hypermetabolic left tonsillar mass as well as hypermetabolic left level 2 lymph nodes measuring up to 1.4 cm.  He was also noted to have a right shoulder joint effusion associated with hypermetabolic activity worrisome for infection.  He recently had the tooth pulled on the left yesterday.  He specifically denies any dysphagia.  Is having significant pain from the tooth extraction.  Tumor was p16 positive.  Patient has seen medical oncology with recommendation for concurrent treatment.  PLANNED TREATMENT REGIMEN: Concurrent chemoradiation  PAST MEDICAL HISTORY:  has a past medical history of Anxiety, Asthma, GERD (gastroesophageal reflux disease), and Hypertension.    PAST SURGICAL HISTORY: No past surgical history on file.  FAMILY HISTORY: family history includes Aneurysm in his father; Cancer in his sister; Lung cancer in his paternal aunt and paternal uncle; Pneumonia in his paternal grandfather.  SOCIAL HISTORY:  reports that he has quit smoking. His smoking use included cigarettes. He has quit using smokeless tobacco. He reports  that he does not currently use alcohol. He reports that he does not currently use drugs after having used the following drugs: "Crack" cocaine.  ALLERGIES: Citalopram and Other  MEDICATIONS:  Current Outpatient Medications  Medication Sig Dispense Refill   albuterol (VENTOLIN HFA) 108 (90 Base) MCG/ACT inhaler      amLODipine (NORVASC) 10 MG tablet Take 1 tablet by mouth daily.     aspirin 81 MG chewable tablet      lisinopril (ZESTRIL) 5 MG tablet      metFORMIN (GLUCOPHAGE-XR) 500 MG 24 hr tablet      nitroGLYCERIN (NITROSTAT) 0.4 MG SL tablet Place under the tongue.     sertraline (ZOLOFT) 25 MG tablet      No current facility-administered medications for this encounter.    ECOG PERFORMANCE STATUS:  1 - Symptomatic but completely ambulatory  REVIEW OF SYSTEMS: Patient denies any weight loss, fatigue, weakness, fever, chills or night sweats. Patient denies any loss of vision, blurred vision. Patient denies any ringing  of the ears or hearing loss. No irregular heartbeat. Patient denies heart murmur or history of fainting. Patient denies any chest pain or pain radiating to her upper extremities. Patient denies any shortness of breath, difficulty breathing at night, cough or hemoptysis. Patient denies any swelling in the lower legs. Patient denies any nausea vomiting, vomiting of blood, or coffee ground material in the vomitus. Patient denies any stomach pain. Patient states has had normal bowel movements no significant constipation or diarrhea. Patient denies any dysuria, hematuria or significant nocturia. Patient denies any problems walking, swelling in the joints or loss  of balance. Patient denies any skin changes, loss of hair or loss of weight. Patient denies any excessive worrying or anxiety or significant depression. Patient denies any problems with insomnia. Patient denies excessive thirst, polyuria, polydipsia. Patient denies any swollen glands, patient denies easy bruising or easy  bleeding. Patient denies any recent infections, allergies or URI. Patient "s visual fields have not changed significantly in recent time.   PHYSICAL EXAM: There were no vitals taken for this visit. Patient has slight trismus.  There is a slightly ulcerated mass in the left tonsillar region.  He has a fixed matted area of lymph node in the left subdigastric region consistent with known malignancy.  No other adenopathy in the head and neck region is noted.  No supraclavicular adenopathy is identified.  Well-developed well-nourished patient in NAD. HEENT reveals PERLA, EOMI, discs not visualized.  Oral cavity is clear. No oral mucosal lesions are identified. Neck is clear without evidence of cervical or supraclavicular adenopathy. Lungs are clear to A&P. Cardiac examination is essentially unremarkable with regular rate and rhythm without murmur rub or thrill. Abdomen is benign with no organomegaly or masses noted. Motor sensory and DTR levels are equal and symmetric in the upper and lower extremities. Cranial nerves II through XII are grossly intact. Proprioception is intact. No peripheral adenopathy or edema is identified. No motor or sensory levels are noted. Crude visual fields are within normal range.  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  Would plan on delivering 22 Gray to the hypermetabolic left tonsillar lesion as well of as his left cervical node mass.  Remainder of his left sided lymph nodes received 54 Gray using IMRT treatment planning and delivery.  Patient also received concurrent weekly chemotherapy under medical oncology's direction.  There will be extra effort by both professional staff as well as technical staff to coordinate and manage  concurrent chemoradiation and ensuing side effects during his treatments. Risks and benefits of treatment including possible dysphagia oral mucositis loss of taste skin reaction fatigue all were discussed in detail with the patient.  He has consented to treatment.  We have set up simulation for next week and we will coordinate our treatments with medical oncology's chemotherapy.  I would like to take this opportunity to thank you for allowing me to participate in the care of your patient.Carmina Miller, MD

## 2023-04-01 ENCOUNTER — Encounter: Payer: Self-pay | Admitting: Oncology

## 2023-04-01 NOTE — Progress Notes (Signed)
START ON PATHWAY REGIMEN - Head and Neck     A cycle is every 7 days:     Cisplatin   **Always confirm dose/schedule in your pharmacy ordering system**  Patient Characteristics: Oropharynx, HPV Positive, Preoperative or Nonsurgical Candidate (Clinical Staging), cT0-4, cN1-3 or cT3-4, cN0 Disease Classification: Oropharynx HPV Status: Positive (+) Therapeutic Status: Preoperative or Nonsurgical Candidate (Clinical Staging) AJCC T Category: cT2 AJCC 8 Stage Grouping: I AJCC N Category: cN1 AJCC M Category: cM0 Intent of Therapy: Curative Intent, Discussed with Patient 

## 2023-04-02 ENCOUNTER — Other Ambulatory Visit: Payer: Self-pay

## 2023-04-02 MED ORDER — LIDOCAINE-PRILOCAINE 2.5-2.5 % EX CREA: TOPICAL_CREAM | CUTANEOUS | 3 refills | Status: DC

## 2023-04-02 MED ORDER — ONDANSETRON HCL 8 MG PO TABS: 8.0000 mg | ORAL_TABLET | Freq: Three times a day (TID) | ORAL | 1 refills | Status: AC | PRN

## 2023-04-02 MED ORDER — PROCHLORPERAZINE MALEATE 10 MG PO TABS: 10.0000 mg | ORAL_TABLET | Freq: Four times a day (QID) | ORAL | 1 refills | Status: AC | PRN

## 2023-04-02 MED ORDER — DEXAMETHASONE 4 MG PO TABS: ORAL_TABLET | ORAL | 1 refills | Status: DC

## 2023-04-03 ENCOUNTER — Other Ambulatory Visit: Payer: Self-pay

## 2023-04-05 ENCOUNTER — Encounter: Payer: Self-pay | Admitting: Oncology

## 2023-04-05 NOTE — Progress Notes (Signed)
Pharmacist Chemotherapy Monitoring - Initial Assessment    Anticipated start date: 04/13/23   The following has been reviewed per standard work regarding the patient's treatment regimen: The patient's diagnosis, treatment plan and drug doses, and organ/hematologic function Lab orders and baseline tests specific to treatment regimen  The treatment plan start date, drug sequencing, and pre-medications Prior authorization status  Patient's documented medication list, including drug-drug interaction screen and prescriptions for anti-emetics and supportive care specific to the treatment regimen The drug concentrations, fluid compatibility, administration routes, and timing of the medications to be used The patient's access for treatment and lifetime cumulative dose history, if applicable  The patient's medication allergies and previous infusion related reactions, if applicable   Changes made to treatment plan:  N/A  Follow up needed:  N/A   Sharen Hones, PharmD, BCPS Clinical Pharmacist   04/05/2023  3:59 PM

## 2023-04-07 ENCOUNTER — Other Ambulatory Visit: Payer: Self-pay | Admitting: Student

## 2023-04-07 DIAGNOSIS — C109 Malignant neoplasm of oropharynx, unspecified: Secondary | ICD-10-CM

## 2023-04-07 NOTE — Progress Notes (Signed)
Patient for IR Port Placement on Thurs 04/08/2023, Oneeka from [employer] called and spoke with the patient on the phone and gave pre-procedure instructions. Ernestene Mention said that she made the pt aware to be here at 12:30p, NPO after MN prior to procedure as well as driver post procedure/recovery/discharge. Ernestene Mention said that the pt said that he understood.  He was called 04/06/2023 and 04/07/2023

## 2023-04-08 ENCOUNTER — Telehealth: Payer: Self-pay | Admitting: *Deleted

## 2023-04-08 ENCOUNTER — Ambulatory Visit
Admission: RE | Admit: 2023-04-08 | Discharge: 2023-04-08 | Disposition: A | Discharge status: Home / Self Care | Payer: Medicare Other | Source: Ambulatory Visit | Attending: Oncology | Admitting: Oncology

## 2023-04-08 ENCOUNTER — Other Ambulatory Visit: Payer: Self-pay

## 2023-04-08 ENCOUNTER — Other Ambulatory Visit: Payer: Medicare Other

## 2023-04-08 ENCOUNTER — Encounter: Payer: Self-pay | Admitting: Radiology

## 2023-04-08 ENCOUNTER — Inpatient Hospital Stay: Payer: Medicare Other

## 2023-04-08 DIAGNOSIS — J45909 Unspecified asthma, uncomplicated: Secondary | ICD-10-CM | POA: Diagnosis not present

## 2023-04-08 DIAGNOSIS — I10 Essential (primary) hypertension: Secondary | ICD-10-CM | POA: Insufficient documentation

## 2023-04-08 DIAGNOSIS — C109 Malignant neoplasm of oropharynx, unspecified: Secondary | ICD-10-CM | POA: Insufficient documentation

## 2023-04-08 DIAGNOSIS — I252 Old myocardial infarction: Secondary | ICD-10-CM | POA: Diagnosis not present

## 2023-04-08 DIAGNOSIS — Z87891 Personal history of nicotine dependence: Secondary | ICD-10-CM | POA: Insufficient documentation

## 2023-04-08 DIAGNOSIS — I2510 Atherosclerotic heart disease of native coronary artery without angina pectoris: Secondary | ICD-10-CM | POA: Insufficient documentation

## 2023-04-08 DIAGNOSIS — F419 Anxiety disorder, unspecified: Secondary | ICD-10-CM | POA: Insufficient documentation

## 2023-04-08 HISTORY — PX: IR IMAGING GUIDED PORT INSERTION: IMG5740

## 2023-04-08 LAB — GLUCOSE, CAPILLARY: Glucose-Capillary: 126 mg/dL — ABNORMAL HIGH (ref 70–99)

## 2023-04-08 MED ORDER — FENTANYL CITRATE (PF) 100 MCG/2ML IJ SOLN
INTRAMUSCULAR | Status: AC
  Filled 2023-04-08: qty 2

## 2023-04-08 MED ORDER — HEPARIN SOD (PORK) LOCK FLUSH 100 UNIT/ML IV SOLN
INTRAVENOUS | Status: AC
  Filled 2023-04-08: qty 5

## 2023-04-08 MED ORDER — MIDAZOLAM HCL 2 MG/2ML IJ SOLN
INTRAMUSCULAR | Status: AC
Start: 2023-04-08 — End: ?
  Filled 2023-04-08: qty 2

## 2023-04-08 MED ORDER — HEPARIN SOD (PORK) LOCK FLUSH 100 UNIT/ML IV SOLN
500.0000 [IU] | Freq: Once | INTRAVENOUS | Status: AC
  Administered 2023-04-08: 500 [IU] via INTRAVENOUS

## 2023-04-08 MED ORDER — MIDAZOLAM HCL 2 MG/2ML IJ SOLN
INTRAMUSCULAR | Status: AC | PRN
Start: 2023-04-08 — End: 2023-04-08
  Administered 2023-04-08: .5 mg via INTRAVENOUS
  Administered 2023-04-08: 1 mg via INTRAVENOUS

## 2023-04-08 MED ORDER — FENTANYL CITRATE (PF) 100 MCG/2ML IJ SOLN
INTRAMUSCULAR | Status: AC | PRN
  Administered 2023-04-08: 50 ug via INTRAVENOUS
  Administered 2023-04-08: 25 ug via INTRAVENOUS

## 2023-04-08 MED ORDER — LIDOCAINE HCL 1 % IJ SOLN
20.0000 mL | Freq: Once | INTRAMUSCULAR | Status: AC
  Administered 2023-04-08: 13 mL via INTRADERMAL

## 2023-04-08 MED ORDER — LIDOCAINE HCL 1 % IJ SOLN
INTRAMUSCULAR | Status: AC
  Filled 2023-04-08: qty 20
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
## 2023-04-08 NOTE — Procedures (Signed)
Interventional Radiology Procedure Note  Procedure: Single Lumen Power Port Placement    Access:  Right IJ vein.  Findings: Catheter tip positioned at SVC/RA junction. Port is ready for immediate use.   Complications: None  EBL: < 10 mL  Recommendations:  - Ok to shower in 24 hours - Do not submerge for 7 days - Routine line care   Lashauna Arpin T. Jacobs Golab, M.D Pager:  [phone]   

## 2023-04-08 NOTE — Progress Notes (Signed)
Patient clinically stable post IR Port placement per Dr Fredia Sorrow, tolerated well. Denies complaints post procedure. Received Versed 1.5 mg along with Fentanyl 75 mcg IV for procedure. Report given to [facility] RN post procedure/specials/16

## 2023-04-08 NOTE — Telephone Encounter (Signed)
I called the pharmacy and told them about the decadron, and zofran, and compazine, and emla cream. Pt stayed to see what was going to be done for his meds. Martie Lee told him they can get it at his pharmacy

## 2023-04-09 ENCOUNTER — Ambulatory Visit
Admission: RE | Admit: 2023-04-09 | Discharge: 2023-04-09 | Disposition: A | Discharge status: Home / Self Care | Payer: Medicare Other | Source: Ambulatory Visit | Attending: Radiation Oncology | Admitting: Radiation Oncology

## 2023-04-09 ENCOUNTER — Encounter: Payer: Self-pay | Admitting: Interventional Radiology

## 2023-04-09 DIAGNOSIS — C099 Malignant neoplasm of tonsil, unspecified: Secondary | ICD-10-CM | POA: Insufficient documentation

## 2023-04-09 DIAGNOSIS — C801 Malignant (primary) neoplasm, unspecified: Secondary | ICD-10-CM

## 2023-04-09 DIAGNOSIS — Z510 Encounter for antineoplastic radiation therapy: Secondary | ICD-10-CM | POA: Insufficient documentation

## 2023-04-12 ENCOUNTER — Encounter: Payer: Self-pay | Admitting: Oncology

## 2023-04-12 ENCOUNTER — Other Ambulatory Visit: Payer: Self-pay | Admitting: Oncology

## 2023-04-12 ENCOUNTER — Inpatient Hospital Stay: Payer: Medicare Other | Admitting: Oncology

## 2023-04-12 ENCOUNTER — Inpatient Hospital Stay: Payer: Medicare Other

## 2023-04-12 DIAGNOSIS — C109 Malignant neoplasm of oropharynx, unspecified: Secondary | ICD-10-CM

## 2023-04-12 MED FILL — Fosaprepitant Dimeglumine For IV Infusion 150 MG (Base Eq): INTRAVENOUS | Qty: 5 | Status: AC

## 2023-04-13 ENCOUNTER — Inpatient Hospital Stay: Payer: Medicare Other

## 2023-04-13 DIAGNOSIS — Z510 Encounter for antineoplastic radiation therapy: Secondary | ICD-10-CM | POA: Diagnosis not present

## 2023-04-14 ENCOUNTER — Ambulatory Visit: Payer: Medicare Other

## 2023-04-14 ENCOUNTER — Other Ambulatory Visit: Payer: Self-pay | Admitting: Oncology

## 2023-04-14 ENCOUNTER — Ambulatory Visit: Admission: RE | Admit: 2023-04-14 | Payer: Medicare Other | Source: Ambulatory Visit

## 2023-04-14 ENCOUNTER — Telehealth: Payer: Self-pay

## 2023-04-14 NOTE — Telephone Encounter (Signed)
Patient called & stated that he has a bad mouth infection. He says he had dental work done last week and now has stitches in his mouth. He also stated that his dentist, along with [facility] doctors told him he should not start any treatment or radiation until he gets the issue with his mouth fixed. I told patient we would speak to Dr. Smith Robert and give him a call back.

## 2023-04-15 ENCOUNTER — Other Ambulatory Visit: Payer: Self-pay | Admitting: Oncology

## 2023-04-15 ENCOUNTER — Ambulatory Visit: Payer: Medicare Other

## 2023-04-15 ENCOUNTER — Other Ambulatory Visit: Payer: Self-pay

## 2023-04-16 ENCOUNTER — Ambulatory Visit: Payer: Medicare Other

## 2023-04-19 ENCOUNTER — Ambulatory Visit: Payer: Medicare Other

## 2023-04-20 ENCOUNTER — Ambulatory Visit: Payer: Medicare Other

## 2023-04-22 ENCOUNTER — Ambulatory Visit: Payer: Medicare Other

## 2023-04-22 NOTE — Progress Notes (Signed)
Pharmacist Chemotherapy Monitoring - Initial Assessment    Anticipated start date: 05/04/23   The following has been reviewed per standard work regarding the patient's treatment regimen: The patient's diagnosis, treatment plan and drug doses, and organ/hematologic function Lab orders and baseline tests specific to treatment regimen  The treatment plan start date, drug sequencing, and pre-medications Prior authorization status  Patient's documented medication list, including drug-drug interaction screen and prescriptions for anti-emetics and supportive care specific to the treatment regimen The drug concentrations, fluid compatibility, administration routes, and timing of the medications to be used The patient's access for treatment and lifetime cumulative dose history, if applicable  The patient's medication allergies and previous infusion related reactions, if applicable   Changes made to treatment plan:  N/A  Follow up needed:  N/A   Sharen Hones, [facility], 04/22/2023  9:49 AM

## 2023-04-23 ENCOUNTER — Ambulatory Visit: Payer: Medicare Other

## 2023-04-26 ENCOUNTER — Ambulatory Visit: Payer: Medicare Other

## 2023-04-27 ENCOUNTER — Ambulatory Visit: Payer: Medicare Other

## 2023-04-29 ENCOUNTER — Ambulatory Visit: Payer: Medicare Other

## 2023-04-29 ENCOUNTER — Ambulatory Visit: Admission: RE | Admit: 2023-04-29 | Payer: Medicare Other | Source: Ambulatory Visit

## 2023-04-29 ENCOUNTER — Encounter: Payer: Self-pay | Admitting: Oncology

## 2023-04-29 DIAGNOSIS — C099 Malignant neoplasm of tonsil, unspecified: Secondary | ICD-10-CM | POA: Insufficient documentation

## 2023-04-29 DIAGNOSIS — Z510 Encounter for antineoplastic radiation therapy: Secondary | ICD-10-CM | POA: Insufficient documentation

## 2023-04-30 ENCOUNTER — Ambulatory Visit: Payer: Medicare Other

## 2023-04-30 ENCOUNTER — Other Ambulatory Visit: Payer: Self-pay | Admitting: *Deleted

## 2023-04-30 ENCOUNTER — Inpatient Hospital Stay: Payer: Medicare Other

## 2023-04-30 DIAGNOSIS — C109 Malignant neoplasm of oropharynx, unspecified: Secondary | ICD-10-CM | POA: Insufficient documentation

## 2023-04-30 DIAGNOSIS — Z5111 Encounter for antineoplastic chemotherapy: Secondary | ICD-10-CM | POA: Insufficient documentation

## 2023-04-30 DIAGNOSIS — Z87891 Personal history of nicotine dependence: Secondary | ICD-10-CM | POA: Insufficient documentation

## 2023-04-30 NOTE — Progress Notes (Signed)
 [facility] Clinical Social Work  Initial Assessment   Carl Waters is a 68 y.o. year old male contacted by phone. Clinical Social Work was referred by new patient protocol for assessment of psychosocial needs.   SDOH (Social Determinants of Health) assessments performed: Yes SDOH Interventions    Flowsheet Row Clinical Support from 04/30/2023 in [facility] Dept Of [place]  SDOH Interventions   Food Insecurity Interventions Intervention Not Indicated  Housing Interventions Community Resources Provided  Utilities Interventions Intervention Not Indicated       SDOH Screenings   Food Insecurity: No Food Insecurity (04/30/2023)  Housing: High Risk (04/30/2023)  Transportation Needs: No Transportation Needs (05/21/2020)   Received [facility], [facility]  Utilities: Not At Risk (04/30/2023)  Financial Resource Strain: Low Risk  (05/21/2020)   Received from [facility], [facility]  Social Connections: Unknown (12/27/2018)   Received from [facility], [facility]  Tobacco Use: Medium Risk (04/26/2023)   Received from [facility]     Distress Screen completed: No    03/31/2023    1:33 PM  ONCBCN DISTRESS SCREENING  Screening Type Initial Screening  Distress experienced in past week (1-10) 0      Family/Social Information:  Housing Arrangement: patient lives with his half-brother, Matilda. Family members/support persons in your life? Family and Friends Transportation concerns: no  Employment: Retired  Income source: Product Manager and Secretary/administrator concerns: Yes, due to illness and/or loss of work during treatment Type of concern: Biomedical Scientist access concerns: no Religious or spiritual practice: No Services Currently in place:  Medicare/Medicaid  Coping/ Adjustment to diagnosis: Patient understands treatment plan and what happens next? yes Concerns about diagnosis  and/or treatment: How I will pay for the services I need Patient reported stressors: Housing Hopes and/or priorities: Family Patient enjoys time with family/ friends Current coping skills/ strengths: Manufacturing systems engineer , Radio Producer fund of knowledge , Motivation for treatment/growth , and Supportive family/friends     SUMMARY: Current SDOH Barriers:  Financial constraints related to fixed income.  Clinical Social Work Clinical Goal(s):  Scientist, research (life sciences) options for unmet needs related to:  Financial Strain   Interventions: Discussed common feeling and emotions when being diagnosed with cancer, and the importance of support during treatment Informed patient of the support team roles and support services at [facility] Provided CSW contact information and encouraged patient to call with any questions or concerns Provided patient with information about the advance directives clinic per his request.  Patient receives food stamps and is eligible for the [place].   Made referral to [facility].   Follow Up Plan: CSW will see patient on January 7th at 10:30 in infusion. Patient verbalizes understanding of plan: Yes    Macario CHRISTELLA Au, LCSW Clinical Social Worker [facility]  Patient is participating in a Managed Medicaid Plan:  Yes

## 2023-05-03 ENCOUNTER — Ambulatory Visit
Admission: RE | Admit: 2023-05-03 | Discharge: 2023-05-03 | Disposition: A | Discharge status: Home / Self Care | Payer: Medicare Other | Source: Ambulatory Visit | Attending: Radiation Oncology | Admitting: Radiation Oncology

## 2023-05-03 ENCOUNTER — Other Ambulatory Visit: Payer: Self-pay

## 2023-05-03 DIAGNOSIS — Z510 Encounter for antineoplastic radiation therapy: Secondary | ICD-10-CM | POA: Diagnosis not present

## 2023-05-03 DIAGNOSIS — C099 Malignant neoplasm of tonsil, unspecified: Secondary | ICD-10-CM | POA: Diagnosis present

## 2023-05-03 LAB — RAD ONC ARIA SESSION SUMMARY
Course Elapsed Days: 0
Plan Fractions Treated to Date: 1
Plan Prescribed Dose Per Fraction: 2 Gy
Plan Total Fractions Prescribed: 35
Plan Total Prescribed Dose: 70 Gy
Reference Point Dosage Given to Date: 2 Gy
Reference Point Session Dosage Given: 2 Gy
Session Number: 1

## 2023-05-03 MED FILL — Fosaprepitant Dimeglumine For IV Infusion 150 MG (Base Eq): INTRAVENOUS | Qty: 5 | Status: AC

## 2023-05-04 ENCOUNTER — Other Ambulatory Visit: Payer: Self-pay

## 2023-05-04 ENCOUNTER — Inpatient Hospital Stay: Payer: Medicare Other

## 2023-05-04 ENCOUNTER — Telehealth: Payer: Self-pay | Admitting: *Deleted

## 2023-05-04 ENCOUNTER — Inpatient Hospital Stay (HOSPITAL_BASED_OUTPATIENT_CLINIC_OR_DEPARTMENT_OTHER): Payer: Medicare Other | Admitting: Internal Medicine

## 2023-05-04 ENCOUNTER — Ambulatory Visit
Admission: RE | Admit: 2023-05-04 | Discharge: 2023-05-04 | Disposition: A | Discharge status: Home / Self Care | Payer: Medicare Other | Source: Ambulatory Visit | Attending: Radiation Oncology | Admitting: Radiation Oncology

## 2023-05-04 ENCOUNTER — Encounter: Payer: Self-pay | Admitting: Oncology

## 2023-05-04 VITALS — BP 128/83 | HR 79

## 2023-05-04 VITALS — BP 127/77 | HR 87 | Temp 97.9°F | Resp 18 | Ht 72.0 in | Wt 192.5 lb

## 2023-05-04 DIAGNOSIS — C109 Malignant neoplasm of oropharynx, unspecified: Secondary | ICD-10-CM

## 2023-05-04 DIAGNOSIS — Z5111 Encounter for antineoplastic chemotherapy: Secondary | ICD-10-CM | POA: Insufficient documentation

## 2023-05-04 DIAGNOSIS — Z510 Encounter for antineoplastic radiation therapy: Secondary | ICD-10-CM | POA: Diagnosis not present

## 2023-05-04 LAB — BASIC METABOLIC PANEL - CANCER CENTER ONLY
Anion gap: 12 (ref 5–15)
BUN: 14 mg/dL (ref 8–23)
CO2: 26 mmol/L (ref 22–32)
Calcium: 9.3 mg/dL (ref 8.9–10.3)
Chloride: 100 mmol/L (ref 98–111)
Creatinine: 0.73 mg/dL (ref 0.61–1.24)
GFR, Estimated: >60 mL/min (ref >60)
Glucose, Bld: 127 mg/dL — ABNORMAL HIGH (ref 70–99)
Potassium: 4.2 mmol/L (ref 3.5–5.1)
Sodium: 138 mmol/L (ref 135–145)

## 2023-05-04 LAB — MAGNESIUM: Magnesium: 2.5 mg/dL — ABNORMAL HIGH (ref 1.7–2.4)

## 2023-05-04 LAB — RAD ONC ARIA SESSION SUMMARY
Course Elapsed Days: 1
Plan Fractions Treated to Date: 2
Plan Prescribed Dose Per Fraction: 2 Gy
Plan Total Fractions Prescribed: 35
Plan Total Prescribed Dose: 70 Gy
Reference Point Dosage Given to Date: 4 Gy
Reference Point Session Dosage Given: 2 Gy
Session Number: 2

## 2023-05-04 LAB — CBC WITH DIFFERENTIAL (CANCER CENTER ONLY)
Abs Immature Granulocytes: 0.03 10*3/uL (ref 0.00–0.07)
Basophils Absolute: 0.1 10*3/uL (ref 0.0–0.1)
Basophils Relative: 1 %
Eosinophils Absolute: 0.2 10*3/uL (ref 0.0–0.5)
Eosinophils Relative: 1 %
HCT: 49.5 % (ref 39.0–52.0)
Hemoglobin: 17.3 g/dL — ABNORMAL HIGH (ref 13.0–17.0)
Immature Granulocytes: 0 %
Lymphocytes Relative: 27 %
Lymphs Abs: 2.8 10*3/uL (ref 0.7–4.0)
MCH: 32.2 pg (ref 26.0–34.0)
MCHC: 34.9 g/dL (ref 30.0–36.0)
MCV: 92 fL (ref 80.0–100.0)
Monocytes Absolute: 1.2 10*3/uL — ABNORMAL HIGH (ref 0.1–1.0)
Monocytes Relative: 12 %
Neutro Abs: 6.3 10*3/uL (ref 1.7–7.7)
Neutrophils Relative %: 59 %
Platelet Count: 193 10*3/uL (ref 150–400)
RBC: 5.38 MIL/uL (ref 4.22–5.81)
RDW: 12.7 % (ref 11.5–15.5)
WBC Count: 10.6 10*3/uL — ABNORMAL HIGH (ref 4.0–10.5)
nRBC: 0 % (ref 0.0–0.2)

## 2023-05-04 MED ORDER — PALONOSETRON HCL INJECTION 0.25 MG/5ML
0.2500 mg | Freq: Once | INTRAVENOUS | Status: AC
  Administered 2023-05-04: 0.25 mg via INTRAVENOUS
  Filled 2023-05-04: qty 5

## 2023-05-04 MED ORDER — HEPARIN SOD (PORK) LOCK FLUSH 100 UNIT/ML IV SOLN
500.0000 [IU] | Freq: Once | INTRAVENOUS | Status: AC | PRN
  Administered 2023-05-04: 500 [IU]
  Filled 2023-05-04: qty 5

## 2023-05-04 MED ORDER — POTASSIUM CHLORIDE IN NACL 20-0.9 MEQ/L-% IV SOLN
Freq: Once | INTRAVENOUS | Status: AC
  Filled 2023-05-04: qty 1000

## 2023-05-04 MED ORDER — SODIUM CHLORIDE 0.9 % IV SOLN
40.0000 mg/m2 | Freq: Once | INTRAVENOUS | Status: AC
  Administered 2023-05-04: 85 mg via INTRAVENOUS
  Filled 2023-05-04: qty 85

## 2023-05-04 MED ORDER — SODIUM CHLORIDE 0.9% FLUSH
10.0000 mL | INTRAVENOUS | Status: DC | PRN
  Administered 2023-05-04: 10 mL
  Filled 2023-05-04: qty 10

## 2023-05-04 MED ORDER — MAGNESIUM SULFATE 2 GM/50ML IV SOLN: 2.0000 g | Freq: Once | INTRAVENOUS | Status: DC

## 2023-05-04 MED ORDER — DEXAMETHASONE SODIUM PHOSPHATE 10 MG/ML IJ SOLN
10.0000 mg | Freq: Once | INTRAMUSCULAR | Status: AC
  Administered 2023-05-04: 10 mg via INTRAVENOUS
  Filled 2023-05-04: qty 1

## 2023-05-04 MED ORDER — SODIUM CHLORIDE 0.9 % IV SOLN
150.0000 mg | Freq: Once | INTRAVENOUS | Status: AC
  Administered 2023-05-04: 150 mg via INTRAVENOUS
  Filled 2023-05-04: qty 150
  Filled 2023-05-04: qty 5

## 2023-05-04 MED ORDER — SODIUM CHLORIDE 0.9 % IV SOLN
INTRAVENOUS | Status: DC
  Filled 2023-05-04: qty 250

## 2023-05-04 NOTE — Patient Instructions (Signed)

## 2023-05-04 NOTE — Progress Notes (Signed)
 [facility] CSW Progress Note  Visual Merchandiser met with patient in infusion to discuss advance directives.  Patient stated he wanted to complete his financial will, not living will.  CSW provided active listening and supportive counseling.  Provided patient with the contact information [employer] [phone].    Macario CHRISTELLA Au, LCSW Clinical Social Worker [facility]    Patient is participating in a Managed Medicaid Plan:  Yes

## 2023-05-04 NOTE — Progress Notes (Signed)
 Nutrition Assessment   Reason for Assessment:   New head and neck cancer.    ASSESSMENT:  68 year old male with SCC of left tonsil, HPV +.  Past medical history of CAD, HTN, MI, asthma, polysubstance abuse.  Patient receiving concurrent radiation and cisplatin .  Met with patient during infusion. Reports that his appetite is good.  Eats 2 meals a day (breakfast and supper). Snacks on pudding, cookies during the day.  Typically has 3 eggs for breakfast and sometimes has bacon.  Dinner maybe sloppy Joe or soup.  Patient cooks and sometimes gets meals out.  Brother currently lives with him.  Drinks gatorade, water, green tea, juice, sometimes sweet tea.  Does not like ensure/boost shakes.  Denies dysphagia at this time or mouth pain.     Medications: metformin, compazine , zofran , dexamethasone    Labs: glucose 127   Anthropometrics:   Height: 72 inches Weight: 192 lb today 200 lb on 03/16/23  BMI: 26  4% weight loss in the last month and half, concerning   Estimated Energy Needs  Kcals: 2175-2600 Protein: 104-130 g Fluid: > 2 L   NUTRITION DIAGNOSIS: Predicted sub optimal energy intake related to tonsil cancer as evidenced by side effects from radiation and chemotherapy will effect ability to take in food as treatment progresses    INTERVENTION:  Discussed importance of nutrition during treatment and weight maintenance Discussed foods high in protein and encouraged protein source at every meal.   Discussed soft, moist protein foods.  Handout provided Consider referral to SLP as patient at risk of side effects (early and late) from radiation.  Message sent to MD.   Contact information provided   MONITORING, EVALUATION, GOAL: weight trends, intake   Next Visit: Tuesday, Jan 21 during infusion  Londa Mackowski B. Dasie, RD, LDN Registered Dietitian [phone]

## 2023-05-04 NOTE — Telephone Encounter (Signed)
-----   Message from Genevia Rous sent at 05/04/2023  4:03 PM EST ----- Regarding: RE:  Powell- could you please place order. Thank you. ----- Message ----- From: Dasie Simple, RD Sent: 05/04/2023   4:00 PM EST To: Powell JINNY Lin, RN; Genevia Rous, MD  Dr A,  Consider Ambulatory referral to SLP [facility]) as patient at risk for dysphagia due to side effects from radiation.  If you agree please place referral to SLP.    Will follow along,  Joli

## 2023-05-04 NOTE — Progress Notes (Signed)
 Hematology/Oncology Consult note [facility] [phone] [phone]  Patient Care Team: Inc, [facility] as PCP - General   Name of the patient: Carl Waters  [id number]  1955-09-28    Reason for referral-new diagnosis of squamous cell carcinoma of the oropharynx   Referring physician- Dr. Bucky  Date of visit: 05/04/23   History of presenting illness- Patient is a 68 year old male with a past medical history significant for polysubstance abuse currently in remission per patient, CAD with prior MI in 2010, hypertension who presented with left-sided neck swelling and underwent PET CT scan on 03/16/2023 which showed a left tonsillar mass measuring 2.5 x 2.9 cm with an SUV of 11.7.  Hypermetabolic left level 2 lymph nodes measuring up to 1.4 cm with an SUV of 5.6.  No additional areas of hypermetabolism.  CT Neck (02/15/2023) showed 3.4 x 2.3 x 2.7 cm enhancing mass centered in the left tonsillar fossa with possible extension to the left glossotonsillar sulcus and the left retromolar trigone and inferior extent into the left piriform sinus. There is a defect in the left posterior mandibular body, which may be related to recent tooth extraction, however it would be difficult to exclude the possibility of tumor involvement in this region. --Metastatic left level 2A conglomerate with additional suspicious left level 3 lymphadenopathy. Clustered, subcentimeter nodes in the left supraclavicular region are indeterminate but suspicious.    Patient underwent left tonsillar biopsy which was consistent with HPV positive squamous cell carcinoma.  Left glossotonsillar sulcus biopsy was also consistent with HPV positive SCC.  Patient was seen by Dr. Delilah from [facility] and he was recommended concurrent chemoradiation with weekly cisplatin  40 mg/m.  There was possible concern for left supraclavicular lymph node involvement as per discussion in [facility] although it  was not noted overtly on PET scan  Patient reports mild pain in his left neck but denies any difficulty swallowing.  States that he has not gone back to any polysubstance abuse presently.  He would like to get his care locally [facility] at [facility].  Interval history Patient was seen today as follow-up prior to cycle 1 of cisplatin . He started radiation yesterday.  Doing well overall.  Denies any new concerns.  Reports he saw a dentist and they did not think he needed any intervention immediately.  ECOG PS- 1  Pain scale- 3   Review of systems- Review of Systems  Constitutional:  Negative for chills, fever, malaise/fatigue and weight loss.  HENT:  Negative for congestion, ear discharge and nosebleeds.   Eyes:  Negative for blurred vision.  Respiratory:  Negative for cough, hemoptysis, sputum production, shortness of breath and wheezing.   Cardiovascular:  Negative for chest pain, palpitations, orthopnea and claudication.  Gastrointestinal:  Negative for abdominal pain, blood in stool, constipation, diarrhea, heartburn, melena, nausea and vomiting.  Genitourinary:  Negative for dysuria, flank pain, frequency, hematuria and urgency.  Musculoskeletal:  Negative for back pain, joint pain and myalgias.  Skin:  Negative for rash.  Neurological:  Negative for dizziness, tingling, focal weakness, seizures, weakness and headaches.  Endo/Heme/Allergies:  Does not bruise/bleed easily.  Psychiatric/Behavioral:  Negative for depression and suicidal ideas. The patient does not have insomnia.     Allergies  Allergen Reactions   Citalopram Rash    Other reaction(s): broke out (he thinks, possibly from dye on generic?) prescribed by [facility] Jan 2013   Misc. Sulfonamide Containing Compounds     Other reaction(s): Do not use as he  has been using crack cocaine   Other     Beta blockers    Patient Active Problem List   Diagnosis Date Noted   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    Goals of care, counseling/discussion 03/31/2023     Past Medical History:  Diagnosis Date   Anxiety    Asthma    GERD (gastroesophageal reflux disease)    Hypertension      Past Surgical History:  Procedure Laterality Date   IR IMAGING GUIDED PORT INSERTION  04/08/2023    Social History   Socioeconomic History   Marital status: Single    Spouse name: Not on file   Number of children: Not on file   Years of education: Not on file   Highest education level: Not on file  Occupational History   Not on file  Tobacco Use   Smoking status: Former    Types: Cigarettes   Smokeless tobacco: Former  Substance and Sexual Activity   Alcohol use: Not Currently    Comment: Quit drinking 15-20 years ago.   Drug use: Not Currently    Types: Crack cocaine    Comment: Stop using being 15-20 yrs ago.   Sexual activity: Not Currently  Other Topics Concern   Not on file  Social History Narrative   Not on file   Social Drivers of Health   Financial Resource Strain: Low Risk  (05/21/2020)   Received from [facility], [facility]   Overall Financial Resource Strain (CARDIA)    Difficulty of Paying Living Expenses: Not very hard  Food Insecurity: No Food Insecurity (05/04/2023)   Hunger Vital Sign    Worried About Running Out of Food in the Last Year: Never true    Ran Out of Food in the Last Year: Never true  Transportation Needs: No Transportation Needs (05/04/2023)   PRAPARE - Administrator, Civil Service (Medical): No    Lack of Transportation (Non-Medical): No  Physical Activity: Inactive (05/04/2023)   Exercise Vital Sign    Days of Exercise per Week: 0 days    Minutes of Exercise per Session: 0 min  Stress: Not on file  Social Connections: Unknown (05/04/2023)   Social Connection and Isolation Panel [NHANES]    Frequency of Communication with Friends and Family: Three times a week    Frequency of Social Gatherings with Friends and Family: Three times a week     Attends Religious Services: More than 4 times per year    Active Member of Clubs or Organizations: No    Attends Banker Meetings: Never    Marital Status: Not on file  Intimate Partner Violence: Not At Risk (05/04/2023)   Humiliation, Afraid, Rape, and Kick questionnaire    Fear of Current or Ex-Partner: No    Emotionally Abused: No    Physically Abused: No    Sexually Abused: No     Family History  Problem Relation Age of Onset   Aneurysm Father    Cancer Sister    Lung cancer Paternal Aunt    Lung cancer Paternal Uncle    Pneumonia Paternal Grandfather      Current Outpatient Medications:    albuterol (VENTOLIN HFA) 108 (90 Base) MCG/ACT inhaler, , Disp: , Rfl:    amLODipine (NORVASC) 10 MG tablet, Take 1 tablet by mouth daily., Disp: , Rfl:    aspirin  81 MG chewable tablet, , Disp: , Rfl:    lidocaine -prilocaine  (EMLA ) cream,  Apply to affected area once, Disp: 30 g, Rfl: 3   lisinopril (ZESTRIL) 5 MG tablet, , Disp: , Rfl:    metFORMIN (GLUCOPHAGE-XR) 500 MG 24 hr tablet, , Disp: , Rfl:    sertraline (ZOLOFT) 25 MG tablet, , Disp: , Rfl:    dexamethasone  (DECADRON ) 4 MG tablet, Take 2 tablets (8 mg) by mouth daily x 3 days starting the day after cisplatin  chemotherapy. Take with food. (Patient not taking: Reported on 05/04/2023), Disp: 30 tablet, Rfl: 1   nitroGLYCERIN (NITROSTAT) 0.4 MG SL tablet, Place under the tongue. (Patient not taking: Reported on 05/04/2023), Disp: , Rfl:    ondansetron  (ZOFRAN ) 8 MG tablet, Take 1 tablet (8 mg total) by mouth every 8 (eight) hours as needed for nausea or vomiting. Start on the third day after cisplatin . (Patient not taking: Reported on 05/04/2023), Disp: 30 tablet, Rfl: 1   prochlorperazine  (COMPAZINE ) 10 MG tablet, Take 1 tablet (10 mg total) by mouth every 6 (six) hours as needed (Nausea or vomiting). (Patient not taking: Reported on 05/04/2023), Disp: 30 tablet, Rfl: 1 No current facility-administered medications for this  visit.  Facility-Administered Medications Ordered in Other Visits:    0.9 %  sodium chloride  infusion, , Intravenous, Continuous, Melanee Piggs C, MD   0.9 % NaCl with KCl 20 mEq/ L  infusion, , Intravenous, Once, Melanee Piggs BROCKS, MD   CISplatin  (PLATINOL ) 85 mg in sodium chloride  0.9 % 250 mL chemo infusion, 40 mg/m2 (Treatment Plan Recorded), Intravenous, Once, Melanee Piggs BROCKS, MD   dexamethasone  (DECADRON ) injection 10 mg, 10 mg, Intravenous, Once, Melanee Piggs BROCKS, MD   fosaprepitant  (EMEND) 150 mg in sodium chloride  0.9 % 145 mL IVPB, 150 mg, Intravenous, Once, Melanee Piggs BROCKS, MD   magnesium  sulfate IVPB 2 g 50 mL, 2 g, Intravenous, Once, Melanee Piggs BROCKS, MD   palonosetron  (ALOXI ) injection 0.25 mg, 0.25 mg, Intravenous, Once, Melanee Piggs BROCKS, MD   Physical exam:  Vitals:   05/04/23 0953  BP: 127/77  Pulse: 87  Resp: 18  Temp: 97.9 F (36.6 C)  TempSrc: Tympanic  Weight: 192 lb 8 oz (87.3 kg)  Height: 6' (1.829 m)   Physical Exam Cardiovascular:     Rate and Rhythm: Normal rate and regular rhythm.     Heart sounds: Normal heart sounds.  Pulmonary:     Effort: Pulmonary effort is normal.     Breath sounds: Normal breath sounds.  Abdominal:     General: Bowel sounds are normal.     Palpations: Abdomen is soft.  Lymphadenopathy:     Comments: Palpable left cervical adenopathy level 2  Skin:    General: Skin is warm and dry.  Neurological:     Mental Status: He is alert and oriented to person, place, and time.           Latest Ref Rng & Units 05/04/2023    9:30 AM  CMP  Glucose 70 - 99 mg/dL 872   BUN 8 - 23 mg/dL 14   Creatinine 9.38 - 1.24 mg/dL 9.26   Sodium 864 - 854 mmol/L 138   Potassium 3.5 - 5.1 mmol/L 4.2   Chloride 98 - 111 mmol/L 100   CO2 22 - 32 mmol/L 26   Calcium 8.9 - 10.3 mg/dL 9.3       Latest Ref Rng & Units 05/04/2023    9:30 AM  CBC  WBC 4.0 - 10.5 K/uL 10.6   Hemoglobin 13.0 - 17.0 g/dL 17.3  Hematocrit 39.0 - 52.0 % 49.5   Platelets 150 -  400 K/uL 193       IR IMAGING GUIDED PORT INSERTION Result Date: 04/08/2023 CLINICAL DATA:  Tonsillar carcinoma and need for porta cath to begin chemotherapy. EXAM: IMPLANTED PORT A CATH PLACEMENT WITH ULTRASOUND AND FLUOROSCOPIC GUIDANCE ANESTHESIA/SEDATION: Moderate (conscious) sedation was employed during this procedure. A total of Versed  1.5 mg and Fentanyl  75 mcg was administered intravenously. Moderate Sedation Time: 26 minutes. The patient's level of consciousness and vital signs were monitored continuously by radiology nursing throughout the procedure under my direct supervision. FLUOROSCOPY: 36 seconds.  3.0 mGy. PROCEDURE: The procedure, risks, benefits, and alternatives were explained to the patient. Questions regarding the procedure were encouraged and answered. The patient understands and consents to the procedure. A time-out was performed prior to initiating the procedure. Ultrasound was utilized to confirm patency of the right internal jugular vein. An ultrasound image was saved and recorded. The right neck and chest were prepped with chlorhexidine  in a sterile fashion, and a sterile drape was applied covering the operative field. Maximum barrier sterile technique with sterile gowns and gloves were used for the procedure. Local anesthesia was provided with 1% lidocaine . After creating a small venotomy incision, a 21 gauge needle was advanced into the right internal jugular vein under direct, real-time ultrasound guidance. Ultrasound image documentation was performed. After securing guidewire access, an 8 Fr dilator was placed. A J-wire was kinked to measure appropriate catheter length. A subcutaneous port pocket was then created along the upper chest wall utilizing sharp and blunt dissection. Portable cautery was utilized. The pocket was irrigated with sterile saline. A single lumen power injectable port was chosen for placement. The 8 Fr catheter was tunneled from the port pocket site to the  venotomy incision. The port was placed in the pocket. External catheter was trimmed to appropriate length based on guidewire measurement. At the venotomy, an 8 Fr peel-away sheath was placed over a guidewire. The catheter was then placed through the sheath and the sheath removed. Final catheter positioning was confirmed and documented with a fluoroscopic spot image. The port was accessed with a needle and aspirated and flushed with heparinized saline. The access needle was removed. The venotomy and port pocket incisions were closed with subcutaneous 3-0 Monocryl and subcuticular 4-0 Vicryl. Dermabond was applied to both incisions. COMPLICATIONS: COMPLICATIONS None FINDINGS: After catheter placement, the tip lies at the cavo-atrial junction. The catheter aspirates normally and is ready for immediate use. IMPRESSION: Placement of single lumen port a cath via right internal jugular vein. The catheter tip lies at the cavo-atrial junction. A power injectable port a cath was placed and is ready for immediate use. Electronically Signed   By: Marcey Moan M.D.   On: 04/08/2023 13:20    Assessment and plan- Patient is a 68 y.o. male with newly diagnosed squamous cell carcinoma of the oropharynx potentially stage I T2 N1 M0 here to discuss further management  Patient is getting treated with concurrent chemo RT.  He started radiation on 05/03/2023.  Labs reviewed and acceptable for treatment.  Will proceed with cycle 1 of cisplatin  40 mg/m today.  Discussed antiemetics and Decadron  regimen.  He will follow-up with Dr. Melanee in 1 week.    Cancer Staging  Squamous cell carcinoma of oropharynx (HCC) Staging form: Pharynx - HPV-Mediated Oropharynx, AJCC 8th Edition - Clinical stage from 03/31/2023: Stage I (cT2, cN1, cM0, p16+) - Signed by Melanee Annah BROCKS, MD on 03/31/2023 Stage  prefix: Initial diagnosis   Thank you for this kind referral and the opportunity to participate in the care of this patient   Visit  Diagnosis 1. Squamous cell carcinoma of oropharynx (HCC)

## 2023-05-04 NOTE — Patient Instructions (Signed)

## 2023-05-05 ENCOUNTER — Encounter: Payer: Self-pay | Admitting: Oncology

## 2023-05-05 ENCOUNTER — Other Ambulatory Visit: Payer: Self-pay

## 2023-05-05 ENCOUNTER — Ambulatory Visit
Admission: RE | Admit: 2023-05-05 | Discharge: 2023-05-05 | Disposition: A | Discharge status: Home / Self Care | Payer: Medicare Other | Source: Ambulatory Visit | Attending: Radiation Oncology | Admitting: Radiation Oncology

## 2023-05-05 DIAGNOSIS — Z510 Encounter for antineoplastic radiation therapy: Secondary | ICD-10-CM | POA: Diagnosis not present

## 2023-05-05 LAB — RAD ONC ARIA SESSION SUMMARY
Course Elapsed Days: 2
Plan Fractions Treated to Date: 3
Plan Prescribed Dose Per Fraction: 2 Gy
Plan Total Fractions Prescribed: 35
Plan Total Prescribed Dose: 70 Gy
Reference Point Dosage Given to Date: 6 Gy
Reference Point Session Dosage Given: 2 Gy
Session Number: 3

## 2023-05-05 NOTE — Telephone Encounter (Signed)
 Pt aware of need for speech eval referral. He stated that he had similar testing previously performed at [facility]. Pt agreeable to apts. He would like nurse to give the apts to him in person. He doesn't always answer the telephone as he cuts his phone off to rest. He doesn't want to miss the phone calls. Msg sent to [facility] regarding referral.  Pt also wanted clarity on the instructions for the decadron . I personally reviewed these instructions with him again. He stated that he would take the 2 tablets of the decadron  when he gets home. He just wanted to clarify that treatment instructions written on the bottle didn't mean after every radiation. I reiterated that he is take it after his each chemotherapy days (weekly on Wed, Thursday and Friday). He gave verbal understanding.

## 2023-05-06 ENCOUNTER — Ambulatory Visit
Admission: RE | Admit: 2023-05-06 | Discharge: 2023-05-06 | Disposition: A | Discharge status: Home / Self Care | Payer: Medicare Other | Source: Ambulatory Visit | Attending: Radiation Oncology | Admitting: Radiation Oncology

## 2023-05-06 ENCOUNTER — Other Ambulatory Visit: Payer: Self-pay

## 2023-05-06 DIAGNOSIS — Z510 Encounter for antineoplastic radiation therapy: Secondary | ICD-10-CM | POA: Diagnosis not present

## 2023-05-06 LAB — RAD ONC ARIA SESSION SUMMARY
Course Elapsed Days: 3
Plan Fractions Treated to Date: 4
Plan Prescribed Dose Per Fraction: 2 Gy
Plan Total Fractions Prescribed: 35
Plan Total Prescribed Dose: 70 Gy
Reference Point Dosage Given to Date: 8 Gy
Reference Point Session Dosage Given: 2 Gy
Session Number: 4

## 2023-05-06 NOTE — Telephone Encounter (Signed)
 Patient had already left today after radiation. So I reached out to patient via telephone to call him about his speech path referral. Patient gave verbal understanding of the plan and agreed to apt.

## 2023-05-07 ENCOUNTER — Ambulatory Visit: Payer: Medicare Other | Attending: Internal Medicine | Admitting: Speech Pathology

## 2023-05-07 ENCOUNTER — Other Ambulatory Visit: Payer: Self-pay

## 2023-05-07 ENCOUNTER — Ambulatory Visit
Admission: RE | Admit: 2023-05-07 | Discharge: 2023-05-07 | Disposition: A | Discharge status: Home / Self Care | Payer: Medicare Other | Source: Ambulatory Visit | Attending: Radiation Oncology | Admitting: Radiation Oncology

## 2023-05-07 DIAGNOSIS — R471 Dysarthria and anarthria: Secondary | ICD-10-CM | POA: Insufficient documentation

## 2023-05-07 DIAGNOSIS — Z510 Encounter for antineoplastic radiation therapy: Secondary | ICD-10-CM | POA: Diagnosis not present

## 2023-05-07 DIAGNOSIS — R1312 Dysphagia, oropharyngeal phase: Secondary | ICD-10-CM | POA: Insufficient documentation

## 2023-05-07 DIAGNOSIS — C109 Malignant neoplasm of oropharynx, unspecified: Secondary | ICD-10-CM | POA: Diagnosis present

## 2023-05-07 LAB — RAD ONC ARIA SESSION SUMMARY
Course Elapsed Days: 4
Plan Fractions Treated to Date: 5
Plan Prescribed Dose Per Fraction: 2 Gy
Plan Total Fractions Prescribed: 35
Plan Total Prescribed Dose: 70 Gy
Reference Point Dosage Given to Date: 10 Gy
Reference Point Session Dosage Given: 2 Gy
Session Number: 5

## 2023-05-07 NOTE — Therapy (Signed)
 OUTPATIENT SPEECH LANGUAGE PATHOLOGY ONCOLOGY EVALUATION   Patient Name: Carl Waters MRN: [id number] DOB:07-Sep-1955, 68 y.o., male Today's Date: 05/07/2023  PCP: [facility] Services REFERRING PROVIDER: Genevia Rous, MD   End of Session - 05/07/23 1101     Visit Number 1    Number of Visits 9    Date for SLP Re-Evaluation 07/02/23    Authorization Type Medicare Part A and Part B    Progress Note Due on Visit 10    SLP Start Time 0915    SLP Stop Time  0935    SLP Time Calculation (min) 20 min    Activity Tolerance Patient tolerated treatment well             Past Medical History:  Diagnosis Date   Anxiety    Asthma    GERD (gastroesophageal reflux disease)    Hypertension    Past Surgical History:  Procedure Laterality Date   IR IMAGING GUIDED PORT INSERTION  04/08/2023   Patient Active Problem List   Diagnosis Date Noted   Encounter for antineoplastic chemotherapy 05/04/2023   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   Goals of care, counseling/discussion 03/31/2023    ONSET DATE: 03/31/2023; date of referral  05/04/2023  REFERRING DIAG: C10.9 (ICD-10-CM) - Squamous cell carcinoma of oropharynx (HCC)   THERAPY DIAG:  Squamous cell carcinoma of oropharynx (HCC)  Dysphagia, oropharyngeal phase  Dysarthria and anarthria  Rationale for Evaluation and Treatment Rehabilitation  SUBJECTIVE:   SUBJECTIVE STATEMENT: Pt pleasant, eager Pt accompanied by: self  PERTINENT HISTORY and DIAGNOSTIC FINDINGS:  Patient is a 68 year old male with a past medical history significant for polysubstance abuse currently in remission per patient, CAD with prior MI in 2010, hypertension who presented with left-sided neck swelling and underwent PET CT scan on 03/16/2023 which showed a left tonsillar mass measuring 2.5 x 2.9 cm with an SUV of 11.7.  Hypermetabolic left level 2 lymph nodes measuring up to 1.4 cm with an SUV of 5.6.  No additional areas  of hypermetabolism.  CT Neck (02/15/2023) showed 3.4 x 2.3 x 2.7 cm enhancing mass centered in the left tonsillar fossa with possible extension to the left glossotonsillar sulcus and the left retromolar trigone and inferior extent into the left piriform sinus. There is a defect in the left posterior mandibular body, which may be related to recent tooth extraction, however it would be difficult to exclude the possibility of tumor involvement in this region. --Metastatic left level 2A conglomerate with additional suspicious left level 3 lymphadenopathy. Clustered, subcentimeter nodes in the left supraclavicular region are indeterminate but suspicious.      Patient underwent left tonsillar biopsy which was consistent with HPV positive squamous cell carcinoma.  Left glossotonsillar sulcus biopsy was also consistent with HPV positive SCC.  Patient was seen by [facility]. Delilah from [facility] and he was recommended concurrent chemoradiation with weekly cisplatin  40 mg/m.  There was possible concern for left supraclavicular lymph node involvement as per discussion in [facility] although it was not noted overtly on PET scan.  Pt started radiation on 05/03/2023, cycle 1 of cisplatin  40mg /m2 05/04/2023. Plan created for pt to receive 70 Gray to hypermetabolic left tonsillar lesion as well as his left cervical mode mass. Remainder of his left sided lymph nodes will receive 54 Gray using IMRT treatment.   PAIN:  Are you having pain? No  FALLS: Has patient fallen in last 6 months?  No  LIVING ENVIRONMENT: Lives with: lives alone  Lives in: House/apartment  PLOF:  Level of assistance: Independent with ADLs, Independent with IADLs Employment: On disability   PATIENT GOALS: to improve understanding of oropharyngeal CA, radiation and oropharyngeal function  OBJECTIVE:   COGNITION: Overall cognitive status: Within functional limits for tasks assessed   LANGUAGE: Receptive and Expressive language appeared WNL.  ORAL MOTOR  EXAMINATION Facial : WFL Lingual: WFL Velum: WFL Mandible: WFL Cough: WFL Voice: Hoarse, Strained, Breathy   MOTOR SPEECH: Overall motor speech: Appears intact  CLINICAL SWALLOW ASSESSMENT:   Current diet: regular and thin liquids Objective swallow impairments: none noted during today's session Dentition: missing dentition Patient directly observed with POs: Yes: dysphagia 2 (chopped) and thin liquids  Feeding: able to feed self Liquids provided by: cup and straw Oral phase signs and symptoms: N/A Pharyngeal phase signs and symptoms:  N/A    PATIENT REPORTED OUTCOME MEASURES (PROM):   To be completed   TODAY'S TREATMENT:  Extensive education provided that Neck range of motion exercises should be done to the point of feeling a GENTLE, TOLERABLE stretch only. Demonstration provided with pt able to imitate for the following stretches:  Head Tilt: Forward and Back - Gently bow your head and try to touch your chin to your chest. Raise your chin back to the starting position. Tilt your head back as far as possible so you are looking up at the ceiling. Return your head to the starting position. - Min A  Head Tilt: Side to Side: Tilt your head to the side, bringing your ear toward your shoulder. Do not raise your shoulder to your ear. Keep your shoulder still. Return your head to the starting position. Min A  Head turns: Turn your head to look over your shoulder. Tilt your chin down and try to touch it to your shoulder. Do not raise your shoulder to your chin. Face forward again -Min A    PATIENT EDUCATION: Education details: late effects head/neck radiation on swallow function, HEP procedure, and modification to HEP when difficulty experienced with swallowing during and after radiation course Person educated: Patient Education method: Explanation Education comprehension: needs further education   ASSESSMENT:  CLINICAL IMPRESSION: Patient is a 68 y.o. male who was seen today for  assessment of swallowing as they undergo radiation/chemoradiation therapy.  At this time pt swallowing is deemed WNL/WFL with these POs. No oral or overt s/sx pharyngeal deficits, including aspiration were observed. There are no overt s/s aspiration PNA observed by SLP nor any reported by pt at this time. Data indicate that pt's swallow ability will likely decrease over the course of radiation/chemoradiation therapy and could very well decline over time following the conclusion of that therapy due to muscle disuse atrophy and/or muscle fibrosis. Pt will cont to need to be seen by SLP in order to assess safety of PO intake, assess the need for recommending any objective swallow assessment, and ensuring pt is correctly completing the individualized HEP.  OBJECTIVE IMPAIRMENTS include voice disorder and dysphagia. These impairments are limiting patient from effectively communicating at home and in community and safety when swallowing. Factors affecting potential to achieve goals and functional outcome are  poor health literacy . Patient will benefit from skilled SLP services to address above impairments and improve overall function.  REHAB POTENTIAL: Good   GOALS: Goals reviewed with patient? Yes  SHORT TERM GOALS: Target date: 10 sessions  Patient will participate in objective swallowing evaluation (MBSS) to identify safest diet recommendation as well as therapeutic targets.  Baseline:  Goal status: INITIAL   LONG TERM GOALS: Target date: 07/02/2023  Pt will be able to verbalize understanding of a home exercise program for pharyngeal, trismus and cervical range of motion.   Baseline:  Goal status: INITIAL  2.  Patient will consume recommended diet using strategies and compensations without overt s/sx aspiration >95% of the time.   Baseline:  Goal status: INITIAL  PLAN: SLP FREQUENCY: 1x/week  SLP DURATION: 8 weeks  PLANNED INTERVENTIONS: Aspiration precaution training, Pharyngeal  strengthening exercises, SLP instruction and feedback, Compensatory strategies, Patient/family education, [phone] Treatment of speech (30 or 45 min) , and [id number] Treatment of swallowing function    Laretha Luepke B. Rubbie, M.S., CCC-SLP, Tree Surgeon Certified Brain Injury Specialist [facility]  [facility] Office [phone] Ascom [phone] Fax [phone]

## 2023-05-10 ENCOUNTER — Ambulatory Visit
Admission: RE | Admit: 2023-05-10 | Discharge: 2023-05-10 | Disposition: A | Discharge status: Home / Self Care | Payer: Medicare Other | Source: Ambulatory Visit | Attending: Radiation Oncology | Admitting: Radiation Oncology

## 2023-05-10 ENCOUNTER — Other Ambulatory Visit: Payer: Self-pay

## 2023-05-10 DIAGNOSIS — Z510 Encounter for antineoplastic radiation therapy: Secondary | ICD-10-CM | POA: Diagnosis not present

## 2023-05-10 LAB — RAD ONC ARIA SESSION SUMMARY
Course Elapsed Days: 7
Plan Fractions Treated to Date: 6
Plan Prescribed Dose Per Fraction: 2 Gy
Plan Total Fractions Prescribed: 35
Plan Total Prescribed Dose: 70 Gy
Reference Point Dosage Given to Date: 12 Gy
Reference Point Session Dosage Given: 2 Gy
Session Number: 6

## 2023-05-10 MED FILL — Fosaprepitant Dimeglumine For IV Infusion 150 MG (Base Eq): INTRAVENOUS | Qty: 5 | Status: AC

## 2023-05-11 ENCOUNTER — Inpatient Hospital Stay (HOSPITAL_BASED_OUTPATIENT_CLINIC_OR_DEPARTMENT_OTHER): Payer: Medicare Other | Admitting: Oncology

## 2023-05-11 ENCOUNTER — Other Ambulatory Visit: Payer: Self-pay

## 2023-05-11 ENCOUNTER — Encounter: Payer: Self-pay | Admitting: Oncology

## 2023-05-11 ENCOUNTER — Inpatient Hospital Stay: Payer: Medicare Other

## 2023-05-11 ENCOUNTER — Ambulatory Visit
Admission: RE | Admit: 2023-05-11 | Discharge: 2023-05-11 | Disposition: A | Discharge status: Home / Self Care | Payer: Medicare Other | Source: Ambulatory Visit | Attending: Radiation Oncology | Admitting: Radiation Oncology

## 2023-05-11 VITALS — BP 132/86 | HR 93 | Temp 97.9°F | Resp 19 | Wt 188.9 lb

## 2023-05-11 VITALS — BP 160/94 | HR 84 | Resp 16

## 2023-05-11 DIAGNOSIS — Z5111 Encounter for antineoplastic chemotherapy: Secondary | ICD-10-CM | POA: Diagnosis not present

## 2023-05-11 DIAGNOSIS — Z510 Encounter for antineoplastic radiation therapy: Secondary | ICD-10-CM | POA: Diagnosis not present

## 2023-05-11 DIAGNOSIS — C109 Malignant neoplasm of oropharynx, unspecified: Secondary | ICD-10-CM

## 2023-05-11 LAB — CBC WITH DIFFERENTIAL (CANCER CENTER ONLY)
Abs Immature Granulocytes: 0.04 10*3/uL (ref 0.00–0.07)
Basophils Absolute: 0 10*3/uL (ref 0.0–0.1)
Basophils Relative: 0 %
Eosinophils Absolute: 0.1 10*3/uL (ref 0.0–0.5)
Eosinophils Relative: 1 %
HCT: 47.5 % (ref 39.0–52.0)
Hemoglobin: 16.4 g/dL (ref 13.0–17.0)
Immature Granulocytes: 0 %
Lymphocytes Relative: 22 %
Lymphs Abs: 2.5 10*3/uL (ref 0.7–4.0)
MCH: 31.7 pg (ref 26.0–34.0)
MCHC: 34.5 g/dL (ref 30.0–36.0)
MCV: 91.7 fL (ref 80.0–100.0)
Monocytes Absolute: 1.4 10*3/uL — ABNORMAL HIGH (ref 0.1–1.0)
Monocytes Relative: 13 %
Neutro Abs: 7 10*3/uL (ref 1.7–7.7)
Neutrophils Relative %: 64 %
Platelet Count: 183 10*3/uL (ref 150–400)
RBC: 5.18 MIL/uL (ref 4.22–5.81)
RDW: 12.3 % (ref 11.5–15.5)
WBC Count: 11.1 10*3/uL — ABNORMAL HIGH (ref 4.0–10.5)
nRBC: 0 % (ref 0.0–0.2)

## 2023-05-11 LAB — BASIC METABOLIC PANEL - CANCER CENTER ONLY
Anion gap: 10 (ref 5–15)
BUN: 27 mg/dL — ABNORMAL HIGH (ref 8–23)
CO2: 28 mmol/L (ref 22–32)
Calcium: 9 mg/dL (ref 8.9–10.3)
Chloride: 100 mmol/L (ref 98–111)
Creatinine: 0.83 mg/dL (ref 0.61–1.24)
GFR, Estimated: >60 mL/min (ref >60)
Glucose, Bld: 145 mg/dL — ABNORMAL HIGH (ref 70–99)
Potassium: 4.1 mmol/L (ref 3.5–5.1)
Sodium: 138 mmol/L (ref 135–145)

## 2023-05-11 LAB — RAD ONC ARIA SESSION SUMMARY
Course Elapsed Days: 8
Plan Fractions Treated to Date: 7
Plan Prescribed Dose Per Fraction: 2 Gy
Plan Total Fractions Prescribed: 35
Plan Total Prescribed Dose: 70 Gy
Reference Point Dosage Given to Date: 14 Gy
Reference Point Session Dosage Given: 2 Gy
Session Number: 7

## 2023-05-11 LAB — MAGNESIUM: Magnesium: 2.2 mg/dL (ref 1.7–2.4)

## 2023-05-11 MED ORDER — HEPARIN SOD (PORK) LOCK FLUSH 100 UNIT/ML IV SOLN
500.0000 [IU] | Freq: Once | INTRAVENOUS | Status: AC | PRN
  Administered 2023-05-11: 500 [IU]
  Filled 2023-05-11: qty 5

## 2023-05-11 MED ORDER — MAGNESIUM SULFATE 2 GM/50ML IV SOLN
2.0000 g | Freq: Once | INTRAVENOUS | Status: AC
  Administered 2023-05-11: 2 g via INTRAVENOUS
  Filled 2023-05-11: qty 50

## 2023-05-11 MED ORDER — SODIUM CHLORIDE 0.9 % IV SOLN
150.0000 mg | Freq: Once | INTRAVENOUS | Status: AC
  Administered 2023-05-11: 150 mg via INTRAVENOUS
  Filled 2023-05-11: qty 150

## 2023-05-11 MED ORDER — SODIUM CHLORIDE 0.9 % IV SOLN
INTRAVENOUS | Status: DC
  Filled 2023-05-11: qty 250

## 2023-05-11 MED ORDER — DEXAMETHASONE SODIUM PHOSPHATE 10 MG/ML IJ SOLN
10.0000 mg | Freq: Once | INTRAMUSCULAR | Status: AC
  Administered 2023-05-11: 10 mg via INTRAVENOUS
  Filled 2023-05-11: qty 1

## 2023-05-11 MED ORDER — PALONOSETRON HCL INJECTION 0.25 MG/5ML
0.2500 mg | Freq: Once | INTRAVENOUS | Status: AC
Start: 2023-05-11 — End: 2023-05-11
  Administered 2023-05-11: 0.25 mg via INTRAVENOUS
  Filled 2023-05-11: qty 5

## 2023-05-11 MED ORDER — SODIUM CHLORIDE 0.9 % IV SOLN
40.0000 mg/m2 | Freq: Once | INTRAVENOUS | Status: AC
  Administered 2023-05-11: 85 mg via INTRAVENOUS
  Filled 2023-05-11: qty 85

## 2023-05-11 MED ORDER — ACETAMINOPHEN 325 MG PO TABS
650.0000 mg | ORAL_TABLET | Freq: Once | ORAL | Status: AC
  Administered 2023-05-11: 650 mg via ORAL
  Filled 2023-05-11: qty 2

## 2023-05-11 MED ORDER — POTASSIUM CHLORIDE IN NACL 20-0.9 MEQ/L-% IV SOLN
Freq: Once | INTRAVENOUS | Status: AC
  Filled 2023-05-11: qty 1000

## 2023-05-11 NOTE — Progress Notes (Signed)
 Okay to run hydration fluids with cisplatin per Dr Smith Robert.

## 2023-05-11 NOTE — Progress Notes (Signed)
 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  Hypermetabolic left level 2 lymph nodes measuring up to 1.4 cm with an SUV of 5.6.  No additional areas of hypermetabolism.  CT Neck (02/15/2023) showed 3.4 x 2.3 x 2.7 cm enhancing mass centered in the left tonsillar fossa with possible extension to the left glossotonsillar sulcus and the left retromolar trigone and inferior extent into the left piriform sinus. There is a defect in the left posterior mandibular body, which may be related to recent tooth extraction, however it would be difficult to exclude the possibility of tumor involvement in this region. --Metastatic left level 2A conglomerate with additional suspicious left level 3 lymphadenopathy. Clustered, subcentimeter nodes in the left supraclavicular region are indeterminate but suspicious.      Patient underwent left tonsillar biopsy which was consistent with HPV positive squamous cell carcinoma.  Left glossotonsillar sulcus biopsy was also consistent with HPV positive SCC.  Patient was seen by Dr. Delilah from [facility] and he was recommended concurrent chemoradiation with weekly cisplatin  40 mg/m.  There was possible concern for left  supraclavicular lymph node involvement as per discussion in [facility] although it was not noted overtly on PET scan   Patient reports mild pain in his left neck but denies any difficulty swallowing.  States that he has not gone back to any polysubstance abuse presently.  He would like to get his care locally here at [facility].  Interval history-tolerating chemotherapy well so far.  His oral intake is good and he is able to eat both solids and food and is keeping up with his fluid intake  ECOG PS- 1 Pain scale- 0   Review of systems- Review of Systems  Constitutional:  Positive for malaise/fatigue. Negative for chills, fever and weight loss.  HENT:  Negative for congestion, ear discharge and nosebleeds.   Eyes:  Negative for blurred vision.  Respiratory:  Negative for cough, hemoptysis, sputum production, shortness of breath and wheezing.   Cardiovascular:  Negative for chest pain, palpitations, orthopnea and claudication.  Gastrointestinal:  Negative for abdominal pain, blood in stool, constipation, diarrhea, heartburn, melena, nausea and vomiting.  Genitourinary:  Negative for dysuria, flank pain, frequency, hematuria and urgency.  Musculoskeletal:  Negative for back pain, joint pain and myalgias.  Skin:  Negative for rash.  Neurological:  Negative for dizziness, tingling, focal weakness, seizures, weakness and headaches.  Endo/Heme/Allergies:  Does not bruise/bleed easily.  Psychiatric/Behavioral:  Negative for depression and suicidal ideas. The patient does not have insomnia.       Allergies  Allergen Reactions   Citalopram Rash    Other reaction(s): broke out (he thinks, possibly from dye on generic?) prescribed by [facility] Jan 2013   Misc. Sulfonamide Containing Compounds     Other  reaction(s): Do not use as he has been using crack cocaine   Other     Beta blockers     Past Medical History:  Diagnosis Date   Anxiety    Asthma    GERD (gastroesophageal reflux disease)     Hypertension      Past Surgical History:  Procedure Laterality Date   IR IMAGING GUIDED PORT INSERTION  04/08/2023    Social History   Socioeconomic History   Marital status: Single    Spouse name: Not on file   Number of children: Not on file   Years of education: Not on file   Highest education level: Not on file  Occupational History   Not on file  Tobacco Use   Smoking status: Former    Types: Cigarettes   Smokeless tobacco: Former  Substance and Sexual Activity   Alcohol use: Not Currently    Comment: Quit drinking 15-20 years ago.   Drug use: Not Currently    Types: Crack cocaine    Comment: Stop using being 15-20 yrs ago.   Sexual activity: Not Currently  Other Topics Concern   Not on file  Social History Narrative   Not on file   Social Drivers of Health   Financial Resource Strain: Low Risk  (05/21/2020)   [facility], [facility]   Overall Financial Resource Strain (CARDIA)    Difficulty of Paying Living Expenses: Not very hard  Food Insecurity: No Food Insecurity (05/04/2023)   Hunger Vital Sign    Worried About Running Out of Food in the Last Year: Never true    Ran Out of Food in the Last Year: Never true  Transportation Needs: No Transportation Needs (05/04/2023)   PRAPARE - Administrator, Civil Service (Medical): No    Lack of Transportation (Non-Medical): No  Physical Activity: Inactive (05/04/2023)   Exercise Vital Sign    Days of Exercise per Week: 0 days    Minutes of Exercise per Session: 0 min  Stress: Not on file  Social Connections: Unknown (05/04/2023)   Social Connection and Isolation Panel [NHANES]    Frequency of Communication with Friends and Family: Three times a week    Frequency of Social Gatherings with Friends and Family: Three times a week    Attends Religious Services: More than 4 times per year    Active Member of Clubs or Organizations: No    Attends Banker Meetings: Never     Marital Status: Not on file  Intimate Partner Violence: Not At Risk (05/04/2023)   Humiliation, Afraid, Rape, and Kick questionnaire    Fear of Current or Ex-Partner: No    Emotionally Abused: No    Physically Abused: No    Sexually Abused: No    Family History  Problem Relation Age of Onset   Aneurysm Father    Cancer Sister    Lung cancer Paternal Aunt    Lung cancer Paternal Uncle    Pneumonia Paternal Grandfather      Current Outpatient Medications:    albuterol (VENTOLIN HFA) 108 (90 Base) MCG/ACT inhaler, , Disp: , Rfl:    amLODipine (NORVASC) 10 MG tablet, Take 1 tablet by mouth daily., Disp: , Rfl:    aspirin  81 MG chewable tablet, , Disp: , Rfl:    lidocaine -prilocaine  (EMLA ) cream, Apply to affected area once, Disp: 30 g, Rfl: 3   lisinopril (ZESTRIL) 5 MG tablet, , Disp: , Rfl:  metFORMIN (GLUCOPHAGE-XR) 500 MG 24 hr tablet, , Disp: , Rfl:    sertraline (ZOLOFT) 25 MG tablet, , Disp: , Rfl:    dexamethasone  (DECADRON ) 4 MG tablet, Take 2 tablets (8 mg) by mouth daily x 3 days starting the day after cisplatin  chemotherapy. Take with food. (Patient not taking: Reported on 05/11/2023), Disp: 30 tablet, Rfl: 1   nitroGLYCERIN (NITROSTAT) 0.4 MG SL tablet, Place under the tongue. (Patient not taking: Reported on 05/11/2023), Disp: , Rfl:    ondansetron  (ZOFRAN ) 8 MG tablet, Take 1 tablet (8 mg total) by mouth every 8 (eight) hours as needed for nausea or vomiting. Start on the third day after cisplatin . (Patient not taking: Reported on 05/11/2023), Disp: 30 tablet, Rfl: 1   prochlorperazine  (COMPAZINE ) 10 MG tablet, Take 1 tablet (10 mg total) by mouth every 6 (six) hours as needed (Nausea or vomiting). (Patient not taking: Reported on 05/11/2023), Disp: 30 tablet, Rfl: 1  Physical exam:  Vitals:   05/11/23 0937  Weight: 188 lb 14.4 oz (85.7 kg)   Physical Exam HENT:     Mouth/Throat:     Mouth: Mucous membranes are moist.     Pharynx: Oropharynx is clear.  Cardiovascular:      Rate and Rhythm: Normal rate and regular rhythm.     Heart sounds: Normal heart sounds.  Pulmonary:     Effort: Pulmonary effort is normal.     Breath sounds: Normal breath sounds.  Abdominal:     General: Bowel sounds are normal.     Palpations: Abdomen is soft.  Skin:    General: Skin is warm and dry.  Neurological:     Mental Status: He is alert and oriented to person, place, and time.         Latest Ref Rng & Units 05/04/2023    9:30 AM  CMP  Glucose 70 - 99 mg/dL 872   BUN 8 - 23 mg/dL 14   Creatinine 9.38 - 1.24 mg/dL 9.26   Sodium 864 - 854 mmol/L 138   Potassium 3.5 - 5.1 mmol/L 4.2   Chloride 98 - 111 mmol/L 100   CO2 22 - 32 mmol/L 26   Calcium 8.9 - 10.3 mg/dL 9.3       Latest Ref Rng & Units 05/04/2023    9:30 AM  CBC  WBC 4.0 - 10.5 K/uL 10.6   Hemoglobin 13.0 - 17.0 g/dL 82.6   Hematocrit 60.9 - 52.0 % 49.5   Platelets 150 - 400 K/uL 193      Assessment and plan- Patient is a 68 y.o. male with history of stage I squamous cell carcinoma of the oropharynx T2 N1 M0 here for on treatment assessment prior to cycle 2 of weekly cisplatin  chemotherapy  Counts okay to proceed with cycle 2 Of weekly cisplatin  chemotherapy today.  He will directly proceed for cycle 3 next week and I will see him back in 2 weeks for cycle 4.  Plan is to do 7 weekly cycles of treatment.  So far tolerating treatments well so far.  Oral intake has been adequate and he denies any mouth sores.   Visit Diagnosis 1. Encounter for antineoplastic chemotherapy   2. Squamous cell carcinoma of oropharynx (HCC)      Dr. Annah Skene, MD, MPH [facility] at [facility] [phone] 05/11/2023 9:40 AM

## 2023-05-11 NOTE — Patient Instructions (Signed)
 [facility] - A DEPT OF [facility]  Discharge Instructions: Thank you for choosing [facility] to provide your oncology and hematology care.  If you have a lab appointment with the Cancer Center, please go directly to the Cancer Center and check in at the registration area.  Wear comfortable clothing and clothing appropriate for easy access to any Portacath or PICC line.   We strive to give you quality time with your provider. You may need to reschedule your appointment if you arrive late (15 or more minutes).  Arriving late affects you and other patients whose appointments are after yours.  Also, if you miss three or more appointments without notifying the office, you may be dismissed from the clinic at the provider's discretion.      For prescription refill requests, have your pharmacy contact our office and allow 72 hours for refills to be completed.    Today you received the following chemotherapy and/or immunotherapy agents- cisplatin      To help prevent nausea and vomiting after your treatment, we encourage you to take your nausea medication as directed.  BELOW ARE SYMPTOMS THAT SHOULD BE REPORTED IMMEDIATELY: *FEVER GREATER THAN 100.4 F (38 C) OR HIGHER *CHILLS OR SWEATING *NAUSEA AND VOMITING THAT IS NOT CONTROLLED WITH YOUR NAUSEA MEDICATION *UNUSUAL SHORTNESS OF BREATH *UNUSUAL BRUISING OR BLEEDING *URINARY PROBLEMS (pain or burning when urinating, or frequent urination) *BOWEL PROBLEMS (unusual diarrhea, constipation, pain near the anus) TENDERNESS IN MOUTH AND THROAT WITH OR WITHOUT PRESENCE OF ULCERS (sore throat, sores in mouth, or a toothache) UNUSUAL RASH, SWELLING OR PAIN  UNUSUAL VAGINAL DISCHARGE OR ITCHING   Items with * indicate a potential emergency and should be followed up as soon as possible or go to the Emergency Department if any problems should occur.  Please show the CHEMOTHERAPY ALERT CARD or IMMUNOTHERAPY  ALERT CARD at check-in to the Emergency Department and triage nurse.  Should you have questions after your visit or need to cancel or reschedule your appointment, please contact [facility] - A DEPT OF [facility]  [phone] and follow the prompts.  Office hours are 8:00 a.m. to 4:30 p.m. Monday - Friday. Please note that voicemails left after 4:00 p.m. may not be returned until the following business day.  We are closed weekends and major holidays. You have access to a nurse at all times for urgent questions. Please call the main number to the clinic [phone] and follow the prompts.  For any non-urgent questions, you may also contact your provider using MyChart. We now offer e-Visits for anyone 9 and older to request care online for non-urgent symptoms. For details visit [email or web address].   Also download the MyChart app! Go to the app store, search "MyChart", open the app, select , and log in with your MyChart username and password.

## 2023-05-12 ENCOUNTER — Other Ambulatory Visit: Payer: Self-pay

## 2023-05-12 ENCOUNTER — Ambulatory Visit
Admission: RE | Admit: 2023-05-12 | Discharge: 2023-05-12 | Disposition: A | Discharge status: Home / Self Care | Payer: Medicare Other | Source: Ambulatory Visit | Attending: Radiation Oncology | Admitting: Radiation Oncology

## 2023-05-12 DIAGNOSIS — Z510 Encounter for antineoplastic radiation therapy: Secondary | ICD-10-CM | POA: Diagnosis not present

## 2023-05-12 LAB — RAD ONC ARIA SESSION SUMMARY
Course Elapsed Days: 9
Plan Fractions Treated to Date: 8
Plan Prescribed Dose Per Fraction: 2 Gy
Plan Total Fractions Prescribed: 35
Plan Total Prescribed Dose: 70 Gy
Reference Point Dosage Given to Date: 16 Gy
Reference Point Session Dosage Given: 2 Gy
Session Number: 8

## 2023-05-13 ENCOUNTER — Ambulatory Visit
Admission: RE | Admit: 2023-05-13 | Discharge: 2023-05-13 | Disposition: A | Discharge status: Home / Self Care | Payer: Medicare Other | Source: Ambulatory Visit | Attending: Radiation Oncology | Admitting: Radiation Oncology

## 2023-05-13 ENCOUNTER — Other Ambulatory Visit: Payer: Self-pay

## 2023-05-13 DIAGNOSIS — Z510 Encounter for antineoplastic radiation therapy: Secondary | ICD-10-CM | POA: Diagnosis not present

## 2023-05-13 LAB — RAD ONC ARIA SESSION SUMMARY
Course Elapsed Days: 10
Plan Fractions Treated to Date: 9
Plan Prescribed Dose Per Fraction: 2 Gy
Plan Total Fractions Prescribed: 35
Plan Total Prescribed Dose: 70 Gy
Reference Point Dosage Given to Date: 18 Gy
Reference Point Session Dosage Given: 2 Gy
Session Number: 9

## 2023-05-14 ENCOUNTER — Other Ambulatory Visit: Payer: Self-pay

## 2023-05-14 ENCOUNTER — Ambulatory Visit: Payer: Medicare Other | Admitting: Speech Pathology

## 2023-05-14 ENCOUNTER — Ambulatory Visit
Admission: RE | Admit: 2023-05-14 | Discharge: 2023-05-14 | Disposition: A | Discharge status: Home / Self Care | Payer: Medicare Other | Source: Ambulatory Visit | Attending: Radiation Oncology | Admitting: Radiation Oncology

## 2023-05-14 DIAGNOSIS — Z510 Encounter for antineoplastic radiation therapy: Secondary | ICD-10-CM | POA: Diagnosis not present

## 2023-05-14 LAB — RAD ONC ARIA SESSION SUMMARY
Course Elapsed Days: 11
Plan Fractions Treated to Date: 10
Plan Prescribed Dose Per Fraction: 2 Gy
Plan Total Fractions Prescribed: 35
Plan Total Prescribed Dose: 70 Gy
Reference Point Dosage Given to Date: 20 Gy
Reference Point Session Dosage Given: 2 Gy
Session Number: 10

## 2023-05-17 ENCOUNTER — Ambulatory Visit
Admission: RE | Admit: 2023-05-17 | Discharge: 2023-05-17 | Disposition: A | Discharge status: Home / Self Care | Payer: Medicare Other | Source: Ambulatory Visit | Attending: Radiation Oncology | Admitting: Radiation Oncology

## 2023-05-17 ENCOUNTER — Other Ambulatory Visit: Payer: Self-pay

## 2023-05-17 ENCOUNTER — Telehealth: Payer: Self-pay | Admitting: Speech Pathology

## 2023-05-17 DIAGNOSIS — Z510 Encounter for antineoplastic radiation therapy: Secondary | ICD-10-CM | POA: Diagnosis not present

## 2023-05-17 LAB — RAD ONC ARIA SESSION SUMMARY
Course Elapsed Days: 14
Plan Fractions Treated to Date: 11
Plan Prescribed Dose Per Fraction: 2 Gy
Plan Total Fractions Prescribed: 35
Plan Total Prescribed Dose: 70 Gy
Reference Point Dosage Given to Date: 22 Gy
Reference Point Session Dosage Given: 2 Gy
Session Number: 11

## 2023-05-17 MED FILL — Fosaprepitant Dimeglumine For IV Infusion 150 MG (Base Eq): INTRAVENOUS | Qty: 5 | Status: AC

## 2023-05-17 NOTE — Telephone Encounter (Signed)
Unable to reach pt about no show no call will reach out to [facility] staff to remind pt when he comes in for treatment.   Davin Muramoto B. Dreama Saa, M.S., CCC-SLP, Tree surgeon Certified Brain Injury Specialist [facility]  [facility] Services Office [phone] Ascom [phone] Fax [phone]

## 2023-05-18 ENCOUNTER — Ambulatory Visit: Payer: Medicare Other | Admitting: Oncology

## 2023-05-18 ENCOUNTER — Ambulatory Visit
Admission: RE | Admit: 2023-05-18 | Discharge: 2023-05-18 | Disposition: A | Discharge status: Home / Self Care | Payer: Medicare Other | Source: Ambulatory Visit | Attending: Radiation Oncology | Admitting: Radiation Oncology

## 2023-05-18 ENCOUNTER — Inpatient Hospital Stay: Payer: Medicare Other

## 2023-05-18 ENCOUNTER — Encounter: Payer: Self-pay | Admitting: Oncology

## 2023-05-18 ENCOUNTER — Inpatient Hospital Stay (HOSPITAL_BASED_OUTPATIENT_CLINIC_OR_DEPARTMENT_OTHER): Payer: Medicare Other | Admitting: Oncology

## 2023-05-18 ENCOUNTER — Other Ambulatory Visit: Payer: Self-pay

## 2023-05-18 ENCOUNTER — Ambulatory Visit: Payer: Medicare Other

## 2023-05-18 ENCOUNTER — Other Ambulatory Visit: Payer: Medicare Other

## 2023-05-18 VITALS — BP 95/66 | HR 100 | Temp 97.6°F | Resp 19 | Wt 184.4 lb

## 2023-05-18 DIAGNOSIS — Z5111 Encounter for antineoplastic chemotherapy: Secondary | ICD-10-CM | POA: Diagnosis not present

## 2023-05-18 DIAGNOSIS — C109 Malignant neoplasm of oropharynx, unspecified: Secondary | ICD-10-CM

## 2023-05-18 DIAGNOSIS — K1233 Oral mucositis (ulcerative) due to radiation: Secondary | ICD-10-CM

## 2023-05-18 DIAGNOSIS — Z510 Encounter for antineoplastic radiation therapy: Secondary | ICD-10-CM | POA: Diagnosis not present

## 2023-05-18 LAB — CBC WITH DIFFERENTIAL (CANCER CENTER ONLY)
Abs Immature Granulocytes: 0.05 10*3/uL (ref 0.00–0.07)
Basophils Absolute: 0.1 10*3/uL (ref 0.0–0.1)
Basophils Relative: 0 %
Eosinophils Absolute: 0 10*3/uL (ref 0.0–0.5)
Eosinophils Relative: 0 %
HCT: 46.2 % (ref 39.0–52.0)
Hemoglobin: 16 g/dL (ref 13.0–17.0)
Immature Granulocytes: 0 %
Lymphocytes Relative: 15 %
Lymphs Abs: 1.7 10*3/uL (ref 0.7–4.0)
MCH: 31.9 pg (ref 26.0–34.0)
MCHC: 34.6 g/dL (ref 30.0–36.0)
MCV: 92.2 fL (ref 80.0–100.0)
Monocytes Absolute: 1.2 10*3/uL — ABNORMAL HIGH (ref 0.1–1.0)
Monocytes Relative: 11 %
Neutro Abs: 8.5 10*3/uL — ABNORMAL HIGH (ref 1.7–7.7)
Neutrophils Relative %: 74 %
Platelet Count: 130 10*3/uL — ABNORMAL LOW (ref 150–400)
RBC: 5.01 MIL/uL (ref 4.22–5.81)
RDW: 12.1 % (ref 11.5–15.5)
WBC Count: 11.5 10*3/uL — ABNORMAL HIGH (ref 4.0–10.5)
nRBC: 0 % (ref 0.0–0.2)

## 2023-05-18 LAB — BASIC METABOLIC PANEL - CANCER CENTER ONLY
Anion gap: 8 (ref 5–15)
BUN: 26 mg/dL — ABNORMAL HIGH (ref 8–23)
CO2: 26 mmol/L (ref 22–32)
Calcium: 8.6 mg/dL — ABNORMAL LOW (ref 8.9–10.3)
Chloride: 102 mmol/L (ref 98–111)
Creatinine: 0.88 mg/dL (ref 0.61–1.24)
GFR, Estimated: >60 mL/min (ref >60)
Glucose, Bld: 185 mg/dL — ABNORMAL HIGH (ref 70–99)
Potassium: 4.3 mmol/L (ref 3.5–5.1)
Sodium: 136 mmol/L (ref 135–145)

## 2023-05-18 LAB — RAD ONC ARIA SESSION SUMMARY
Course Elapsed Days: 15
Plan Fractions Treated to Date: 12
Plan Prescribed Dose Per Fraction: 2 Gy
Plan Total Fractions Prescribed: 35
Plan Total Prescribed Dose: 70 Gy
Reference Point Dosage Given to Date: 24 Gy
Reference Point Session Dosage Given: 2 Gy
Session Number: 12

## 2023-05-18 LAB — MAGNESIUM: Magnesium: 2.1 mg/dL (ref 1.7–2.4)

## 2023-05-18 MED ORDER — SODIUM CHLORIDE 0.9 % IV SOLN
INTRAVENOUS | Status: DC
  Filled 2023-05-18: qty 250

## 2023-05-18 MED ORDER — HEPARIN SOD (PORK) LOCK FLUSH 100 UNIT/ML IV SOLN
500.0000 [IU] | Freq: Once | INTRAVENOUS | Status: DC | PRN
  Filled 2023-05-18: qty 5

## 2023-05-18 MED ORDER — MAGIC MOUTHWASH W/LIDOCAINE: 5.0000 mL | Freq: Four times a day (QID) | ORAL | 1 refills | Status: DC | PRN

## 2023-05-18 MED ORDER — POTASSIUM CHLORIDE IN NACL 20-0.9 MEQ/L-% IV SOLN
Freq: Once | INTRAVENOUS | Status: AC
  Filled 2023-05-18: qty 1000

## 2023-05-18 MED ORDER — MAGNESIUM SULFATE 2 GM/50ML IV SOLN
2.0000 g | Freq: Once | INTRAVENOUS | Status: AC
  Administered 2023-05-18: 2 g via INTRAVENOUS
  Filled 2023-05-18: qty 50

## 2023-05-18 MED ORDER — SODIUM CHLORIDE 0.9% FLUSH
10.0000 mL | INTRAVENOUS | Status: DC | PRN
  Administered 2023-05-18: 10 mL via INTRAVENOUS
  Filled 2023-05-18: qty 10

## 2023-05-18 MED ORDER — SODIUM CHLORIDE 0.9 % IV SOLN
150.0000 mg | Freq: Once | INTRAVENOUS | Status: AC
  Administered 2023-05-18: 150 mg via INTRAVENOUS
  Filled 2023-05-18: qty 150

## 2023-05-18 MED ORDER — SODIUM CHLORIDE 0.9 % IV SOLN
40.0000 mg/m2 | Freq: Once | INTRAVENOUS | Status: AC
  Administered 2023-05-18: 85 mg via INTRAVENOUS
  Filled 2023-05-18: qty 85

## 2023-05-18 MED ORDER — PALONOSETRON HCL INJECTION 0.25 MG/5ML
0.2500 mg | Freq: Once | INTRAVENOUS | Status: AC
  Administered 2023-05-18: 0.25 mg via INTRAVENOUS
  Filled 2023-05-18: qty 5

## 2023-05-18 MED ORDER — DEXAMETHASONE SODIUM PHOSPHATE 10 MG/ML IJ SOLN
10.0000 mg | Freq: Once | INTRAMUSCULAR | Status: AC
  Administered 2023-05-18: 10 mg via INTRAVENOUS
  Filled 2023-05-18: qty 1

## 2023-05-18 NOTE — Progress Notes (Signed)
Hematology/Oncology Consult note 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  Hypermetabolic left level 2 lymph nodes measuring up to 1.4 cm with an SUV of 5.6.  No additional areas of hypermetabolism.  CT Neck (02/15/2023) showed 3.4 x 2.3 x 2.7 cm enhancing mass centered in the left tonsillar fossa with possible extension to the left glossotonsillar sulcus and the left retromolar trigone and inferior extent into the left piriform sinus. There is a defect in the left posterior mandibular body, which may be related to recent tooth extraction, however it would be difficult to exclude the possibility of tumor involvement in this region. --Metastatic left level 2A conglomerate with additional suspicious left level 3 lymphadenopathy. Clustered, subcentimeter nodes in the left supraclavicular region are indeterminate but suspicious.      Patient underwent left tonsillar biopsy which was consistent with HPV positive squamous cell carcinoma.  Left glossotonsillar sulcus biopsy was also consistent with HPV positive SCC.  Patient was seen by Dr. Rosana Hoes from [facility] and he was recommended concurrent chemoradiation with weekly cisplatin 40 mg/m.  There was possible  concern for left supraclavicular lymph node involvement as per discussion in [facility] although it was not noted overtly on PET scan   Patient reports mild pain in his left neck but denies any difficulty swallowing.  States that he has not gone back to any polysubstance abuse presently.  He would like to get his care locally here at [facility].  Interval history-patient reports pain in his mouth while swallowing and occasional mouth sores.  He is still trying to keep up with his oral intake.  ECOG PS- 2 Pain scale- 4 Opioid associated constipation- no  Review of systems- Review of Systems  Constitutional:  Negative for chills, fever, malaise/fatigue and weight loss.  HENT:  Positive for sore throat. Negative for congestion, ear discharge and nosebleeds.   Eyes:  Negative for blurred vision.  Respiratory:  Negative for cough, hemoptysis, sputum production, shortness of breath and wheezing.   Cardiovascular:  Negative for chest pain, palpitations, orthopnea and claudication.  Gastrointestinal:  Negative for abdominal pain, blood in stool, constipation, diarrhea, heartburn, melena, nausea and vomiting.  Genitourinary:  Negative for dysuria, flank pain, frequency, hematuria and urgency.  Musculoskeletal:  Negative for back pain, joint pain and myalgias.  Skin:  Negative for rash.  Neurological:  Negative for dizziness, tingling, focal weakness, seizures, weakness and headaches.  Endo/Heme/Allergies:  Does not bruise/bleed easily.  Psychiatric/Behavioral:  Negative for depression and suicidal ideas. The patient does not have insomnia.       Allergies  Allergen Reactions   Citalopram Rash    Other reaction(s): "broke out" (he thinks, possibly from dye on generic?) prescribed by [facility] Jan 2013   Misc. Sulfonamide Containing Compounds  Other reaction(s): Do not use as he has been using crack cocaine   Other     Beta blockers     Past Medical History:  Diagnosis Date   Anxiety    Asthma     GERD (gastroesophageal reflux disease)    Hypertension      Past Surgical History:  Procedure Laterality Date   IR IMAGING GUIDED PORT INSERTION  04/08/2023    Social History   Socioeconomic History   Marital status: Single    Spouse name: Not on file   Number of children: Not on file   Years of education: Not on file   Highest education level: Not on file  Occupational History   Not on file  Tobacco Use   Smoking status: Former    Types: Cigarettes   Smokeless tobacco: Former  Substance and Sexual Activity   Alcohol use: Not Currently    Comment: Quit drinking 15-20 years ago.   Drug use: Not Currently    Types: "Crack" cocaine    Comment: Stop using being 15-20 yrs ago.   Sexual activity: Not Currently  Other Topics Concern   Not on file  Social History Narrative   Not on file   Social Drivers of Health   Financial Resource Strain: Low Risk  (05/21/2020)   Received from [facility], [facility]   Overall Financial Resource Strain (CARDIA)    Difficulty of Paying Living Expenses: Not very hard  Food Insecurity: No Food Insecurity (05/04/2023)   Hunger Vital Sign    Worried About Running Out of Food in the Last Year: Never true    Ran Out of Food in the Last Year: Never true  Transportation Needs: No Transportation Needs (05/04/2023)   PRAPARE - Administrator, Civil Service (Medical): No    Lack of Transportation (Non-Medical): No  Physical Activity: Inactive (05/04/2023)   Exercise Vital Sign    Days of Exercise per Week: 0 days    Minutes of Exercise per Session: 0 min  Stress: Not on file  Social Connections: Unknown (05/04/2023)   Social Connection and Isolation Panel [NHANES]    Frequency of Communication with Friends and Family: Three times a week    Frequency of Social Gatherings with Friends and Family: Three times a week    Attends Religious Services: More than 4 times per year    Active Member of Clubs or Organizations: No     Attends Banker Meetings: Never    Marital Status: Not on file  Intimate Partner Violence: Not At Risk (05/04/2023)   Humiliation, Afraid, Rape, and Kick questionnaire    Fear of Current or Ex-Partner: No    Emotionally Abused: No    Physically Abused: No    Sexually Abused: No    Family History  Problem Relation Age of Onset   Aneurysm Father    Cancer Sister    Lung cancer Paternal Aunt    Lung cancer Paternal Uncle    Pneumonia Paternal Grandfather      Current Outpatient Medications:    albuterol (VENTOLIN HFA) 108 (90 Base) MCG/ACT inhaler, , Disp: , Rfl:    amLODipine (NORVASC) 10 MG tablet, Take 1 tablet by mouth daily., Disp: , Rfl:    aspirin 81 MG chewable tablet, , Disp: , Rfl:    dexamethasone (DECADRON) 4 MG tablet, Take 2 tablets (8 mg) by mouth daily x 3 days starting the day after cisplatin chemotherapy. Take with food.,  Disp: 30 tablet, Rfl: 1   lidocaine-prilocaine (EMLA) cream, Apply to affected area once, Disp: 30 g, Rfl: 3   lisinopril (ZESTRIL) 5 MG tablet, , Disp: , Rfl:    metFORMIN (GLUCOPHAGE-XR) 500 MG 24 hr tablet, , Disp: , Rfl:    sertraline (ZOLOFT) 25 MG tablet, , Disp: , Rfl:    nitroGLYCERIN (NITROSTAT) 0.4 MG SL tablet, Place under the tongue. (Patient not taking: Reported on 05/04/2023), Disp: , Rfl:    ondansetron (ZOFRAN) 8 MG tablet, Take 1 tablet (8 mg total) by mouth every 8 (eight) hours as needed for nausea or vomiting. Start on the third day after cisplatin. (Patient not taking: Reported on 05/04/2023), Disp: 30 tablet, Rfl: 1   prochlorperazine (COMPAZINE) 10 MG tablet, Take 1 tablet (10 mg total) by mouth every 6 (six) hours as needed (Nausea or vomiting). (Patient not taking: Reported on 05/04/2023), Disp: 30 tablet, Rfl: 1 No current facility-administered medications for this visit.  Facility-Administered Medications Ordered in Other Visits:    sodium chloride flush (NS) 0.9 % injection 10 mL, 10 mL, Intravenous, PRN, Creig Hines, MD, 10 mL at 05/18/23 0902  Physical exam:  Vitals:   05/18/23 0921  BP: 95/66  Pulse: 100  Resp: 19  Temp: 97.6 F (36.4 C)  SpO2: 97%  Weight: 184 lb 6.4 oz (83.6 kg)   Physical Exam HENT:     Mouth/Throat:     Comments: Mild grade 1 mucositis noted in the oropharynx Cardiovascular:     Rate and Rhythm: Normal rate and regular rhythm.     Heart sounds: Normal heart sounds.  Pulmonary:     Effort: Pulmonary effort is normal.     Breath sounds: Normal breath sounds.  Abdominal:     General: Bowel sounds are normal.     Palpations: Abdomen is soft.  Skin:    General: Skin is warm and dry.  Neurological:     Mental Status: He is alert and oriented to person, place, and time.         Latest Ref Rng & Units 05/11/2023    9:25 AM  CMP  Glucose 70 - 99 mg/dL 161   BUN 8 - 23 mg/dL 27   Creatinine 0.96 - 1.24 mg/dL 0.45   Sodium 409 - 811 mmol/L 138   Potassium 3.5 - 5.1 mmol/L 4.1   Chloride 98 - 111 mmol/L 100   CO2 22 - 32 mmol/L 28   Calcium 8.9 - 10.3 mg/dL 9.0       Latest Ref Rng & Units 05/18/2023    9:02 AM  CBC  WBC 4.0 - 10.5 K/uL 11.5   Hemoglobin 13.0 - 17.0 g/dL 91.4   Hematocrit 78.2 - 52.0 % 46.2   Platelets 150 - 400 K/uL 130      Assessment and plan- Patient is a 68 y.o. male with history of stage I squamous cell carcinoma of the oropharynx T2 N1 M0.  He is here for on treatment assessment prior to cycle 3 of weekly cisplatin chemotherapy  Counts okay to proceed with cycle 3 of weekly cisplatin chemotherapy today.  He will directly proceed for cycle 4 next week and I will see him back in 2 weeks for cycle 5.  Plan is to do 7 weekly cycles  Radiation mucositis: I am sending a prescription for Magic mouthwash and if pain/mucositis worsens he may need oral opioids during treatment   Visit Diagnosis 1. Encounter for antineoplastic chemotherapy  2. Squamous cell carcinoma of oropharynx (HCC)      Dr. Owens Shark, MD, MPH [facility] at  [facility] [phone] 05/18/2023 9:34 AM

## 2023-05-18 NOTE — Progress Notes (Signed)
Nutrition Follow-up:  Patient with SCC of left tonsil, HPV+.  Patient receiving concurrent radiation and cisplatin.    Met with patient during infusion.  Patient sleeping at the time of visit. Able to get awake but patient never opened eyes during visit.  Seemed frustrated at visit.  Says he is eating milkshakes, macaroni and cheese and other soft foods.  Says that he was moving furniture and pulled a muscle in his stomach.    Medications: reviewed  Labs: reviewed  Anthropometrics:   Weight 184 lb 6.4 oz  192 lb on 1/7 200 lb on 11/9   NUTRITION DIAGNOSIS: Predicted suboptimal energy intake continues   INTERVENTION:  Encouraged patient to continue high calorie, high protein soft foods Was able to communicate with patient and get SLP visit rescheduled.      MONITORING, EVALUATION, GOAL: weight trends, intake   NEXT VISIT: Wednesday, Jan 29 during infusion  Kahil Agner B. Freida Busman, RD, LDN Registered Dietitian [phone]

## 2023-05-18 NOTE — Patient Instructions (Signed)
 [facility] - A DEPT OF [facility]  Discharge Instructions: Thank you for choosing [facility] to provide your oncology and hematology care.  If you have a lab appointment with the Cancer Center, please go directly to the Cancer Center and check in at the registration area.  Wear comfortable clothing and clothing appropriate for easy access to any Portacath or PICC line.   We strive to give you quality time with your provider. You may need to reschedule your appointment if you arrive late (15 or more minutes).  Arriving late affects you and other patients whose appointments are after yours.  Also, if you miss three or more appointments without notifying the office, you may be dismissed from the clinic at the provider's discretion.      For prescription refill requests, have your pharmacy contact our office and allow 72 hours for refills to be completed.    Today you received the following chemotherapy and/or immunotherapy agents Vidaza      To help prevent nausea and vomiting after your treatment, we encourage you to take your nausea medication as directed.  BELOW ARE SYMPTOMS THAT SHOULD BE REPORTED IMMEDIATELY: *FEVER GREATER THAN 100.4 F (38 C) OR HIGHER *CHILLS OR SWEATING *NAUSEA AND VOMITING THAT IS NOT CONTROLLED WITH YOUR NAUSEA MEDICATION *UNUSUAL SHORTNESS OF BREATH *UNUSUAL BRUISING OR BLEEDING *URINARY PROBLEMS (pain or burning when urinating, or frequent urination) *BOWEL PROBLEMS (unusual diarrhea, constipation, pain near the anus) TENDERNESS IN MOUTH AND THROAT WITH OR WITHOUT PRESENCE OF ULCERS (sore throat, sores in mouth, or a toothache) UNUSUAL RASH, SWELLING OR PAIN  UNUSUAL VAGINAL DISCHARGE OR ITCHING   Items with * indicate a potential emergency and should be followed up as soon as possible or go to the Emergency Department if any problems should occur.  Please show the CHEMOTHERAPY ALERT CARD or IMMUNOTHERAPY ALERT  CARD at check-in to the Emergency Department and triage nurse.  Should you have questions after your visit or need to cancel or reschedule your appointment, please contact [facility] - A DEPT OF [facility]  [phone] and follow the prompts.  Office hours are 8:00 a.m. to 4:30 p.m. Monday - Friday. Please note that voicemails left after 4:00 p.m. may not be returned until the following business day.  We are closed weekends and major holidays. You have access to a nurse at all times for urgent questions. Please call the main number to the clinic [phone] and follow the prompts.  For any non-urgent questions, you may also contact your provider using MyChart. We now offer e-Visits for anyone 90 and older to request care online for non-urgent symptoms. For details visit [email or web address].   Also download the MyChart app! Go to the app store, search "MyChart", open the app, select [place], and log in with your MyChart username and password.

## 2023-05-19 ENCOUNTER — Ambulatory Visit
Admission: RE | Admit: 2023-05-19 | Discharge: 2023-05-19 | Disposition: A | Discharge status: Home / Self Care | Payer: Medicare Other | Source: Ambulatory Visit | Attending: Radiation Oncology | Admitting: Radiation Oncology

## 2023-05-19 ENCOUNTER — Other Ambulatory Visit: Payer: Self-pay

## 2023-05-19 DIAGNOSIS — Z510 Encounter for antineoplastic radiation therapy: Secondary | ICD-10-CM | POA: Diagnosis not present

## 2023-05-19 LAB — RAD ONC ARIA SESSION SUMMARY
Course Elapsed Days: 16
Plan Fractions Treated to Date: 13
Plan Prescribed Dose Per Fraction: 2 Gy
Plan Total Fractions Prescribed: 35
Plan Total Prescribed Dose: 70 Gy
Reference Point Dosage Given to Date: 26 Gy
Reference Point Session Dosage Given: 2 Gy
Session Number: 13

## 2023-05-20 ENCOUNTER — Ambulatory Visit
Admission: RE | Admit: 2023-05-20 | Discharge: 2023-05-20 | Disposition: A | Discharge status: Home / Self Care | Payer: Medicare Other | Source: Ambulatory Visit | Attending: Radiation Oncology | Admitting: Radiation Oncology

## 2023-05-20 ENCOUNTER — Other Ambulatory Visit: Payer: Self-pay

## 2023-05-20 DIAGNOSIS — Z510 Encounter for antineoplastic radiation therapy: Secondary | ICD-10-CM | POA: Diagnosis not present

## 2023-05-20 LAB — RAD ONC ARIA SESSION SUMMARY
Course Elapsed Days: 17
Plan Fractions Treated to Date: 14
Plan Prescribed Dose Per Fraction: 2 Gy
Plan Total Fractions Prescribed: 35
Plan Total Prescribed Dose: 70 Gy
Reference Point Dosage Given to Date: 28 Gy
Reference Point Session Dosage Given: 2 Gy
Session Number: 14

## 2023-05-21 ENCOUNTER — Ambulatory Visit: Payer: Medicare Other | Admitting: Speech Pathology

## 2023-05-21 ENCOUNTER — Other Ambulatory Visit: Payer: Self-pay

## 2023-05-21 ENCOUNTER — Ambulatory Visit
Admission: RE | Admit: 2023-05-21 | Discharge: 2023-05-21 | Disposition: A | Discharge status: Home / Self Care | Payer: Medicare Other | Source: Ambulatory Visit | Attending: Radiation Oncology | Admitting: Radiation Oncology

## 2023-05-21 DIAGNOSIS — C109 Malignant neoplasm of oropharynx, unspecified: Secondary | ICD-10-CM

## 2023-05-21 DIAGNOSIS — R1312 Dysphagia, oropharyngeal phase: Secondary | ICD-10-CM

## 2023-05-21 DIAGNOSIS — Z510 Encounter for antineoplastic radiation therapy: Secondary | ICD-10-CM | POA: Diagnosis not present

## 2023-05-21 LAB — RAD ONC ARIA SESSION SUMMARY
Course Elapsed Days: 18
Plan Fractions Treated to Date: 15
Plan Prescribed Dose Per Fraction: 2 Gy
Plan Total Fractions Prescribed: 35
Plan Total Prescribed Dose: 70 Gy
Reference Point Dosage Given to Date: 30 Gy
Reference Point Session Dosage Given: 2 Gy
Session Number: 15
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
## 2023-05-24 ENCOUNTER — Other Ambulatory Visit: Payer: Self-pay

## 2023-05-24 ENCOUNTER — Ambulatory Visit
Admission: RE | Admit: 2023-05-24 | Discharge: 2023-05-24 | Disposition: A | Discharge status: Home / Self Care | Payer: Medicare Other | Source: Ambulatory Visit | Attending: Radiation Oncology | Admitting: Radiation Oncology

## 2023-05-24 DIAGNOSIS — Z510 Encounter for antineoplastic radiation therapy: Secondary | ICD-10-CM | POA: Diagnosis not present

## 2023-05-24 LAB — RAD ONC ARIA SESSION SUMMARY
Course Elapsed Days: 21
Plan Fractions Treated to Date: 16
Plan Prescribed Dose Per Fraction: 2 Gy
Plan Total Fractions Prescribed: 35
Plan Total Prescribed Dose: 70 Gy
Reference Point Dosage Given to Date: 32 Gy
Reference Point Session Dosage Given: 2 Gy
Session Number: 16

## 2023-05-25 ENCOUNTER — Other Ambulatory Visit: Payer: Self-pay | Admitting: Oncology

## 2023-05-25 ENCOUNTER — Encounter: Payer: Self-pay | Admitting: Oncology

## 2023-05-25 ENCOUNTER — Ambulatory Visit
Admission: RE | Admit: 2023-05-25 | Discharge: 2023-05-25 | Disposition: A | Discharge status: Home / Self Care | Payer: Medicare Other | Source: Ambulatory Visit | Attending: Radiation Oncology | Admitting: Radiation Oncology

## 2023-05-25 ENCOUNTER — Other Ambulatory Visit: Payer: Self-pay

## 2023-05-25 DIAGNOSIS — C109 Malignant neoplasm of oropharynx, unspecified: Secondary | ICD-10-CM

## 2023-05-25 DIAGNOSIS — Z510 Encounter for antineoplastic radiation therapy: Secondary | ICD-10-CM | POA: Diagnosis not present

## 2023-05-25 LAB — RAD ONC ARIA SESSION SUMMARY
Course Elapsed Days: 22
Plan Fractions Treated to Date: 17
Plan Prescribed Dose Per Fraction: 2 Gy
Plan Total Fractions Prescribed: 35
Plan Total Prescribed Dose: 70 Gy
Reference Point Dosage Given to Date: 34 Gy
Reference Point Session Dosage Given: 2 Gy
Session Number: 17

## 2023-05-25 NOTE — Addendum Note (Signed)
Encounter addended by: Edward Qualia on: 05/25/2023 1:36 PM  Actions taken: Imaging Exam ended

## 2023-05-25 NOTE — Addendum Note (Signed)
Encounter addended by: Edward Qualia on: 05/25/2023 1:35 PM  Actions taken: Imaging Exam ended

## 2023-05-26 ENCOUNTER — Inpatient Hospital Stay: Payer: Medicare Other

## 2023-05-26 ENCOUNTER — Other Ambulatory Visit: Payer: Self-pay

## 2023-05-26 ENCOUNTER — Ambulatory Visit: Payer: Medicare Other | Admitting: Oncology

## 2023-05-26 ENCOUNTER — Ambulatory Visit
Admission: RE | Admit: 2023-05-26 | Discharge: 2023-05-26 | Disposition: A | Discharge status: Home / Self Care | Payer: Medicare Other | Source: Ambulatory Visit | Attending: Radiation Oncology | Admitting: Radiation Oncology

## 2023-05-26 VITALS — BP 103/72 | HR 94 | Temp 94.9°F | Resp 18 | Wt 174.6 lb

## 2023-05-26 DIAGNOSIS — C109 Malignant neoplasm of oropharynx, unspecified: Secondary | ICD-10-CM

## 2023-05-26 DIAGNOSIS — Z510 Encounter for antineoplastic radiation therapy: Secondary | ICD-10-CM | POA: Diagnosis not present

## 2023-05-26 LAB — RAD ONC ARIA SESSION SUMMARY
Course Elapsed Days: 23
Plan Fractions Treated to Date: 18
Plan Prescribed Dose Per Fraction: 2 Gy
Plan Total Fractions Prescribed: 35
Plan Total Prescribed Dose: 70 Gy
Reference Point Dosage Given to Date: 36 Gy
Reference Point Session Dosage Given: 2 Gy
Session Number: 18

## 2023-05-26 LAB — CBC WITH DIFFERENTIAL (CANCER CENTER ONLY)
Abs Immature Granulocytes: 0.02 10*3/uL (ref 0.00–0.07)
Basophils Absolute: 0 10*3/uL (ref 0.0–0.1)
Basophils Relative: 0 %
Eosinophils Absolute: 0 10*3/uL (ref 0.0–0.5)
Eosinophils Relative: 0 %
HCT: 46 % (ref 39.0–52.0)
Hemoglobin: 15.9 g/dL (ref 13.0–17.0)
Immature Granulocytes: 0 %
Lymphocytes Relative: 26 %
Lymphs Abs: 1.7 10*3/uL (ref 0.7–4.0)
MCH: 31.4 pg (ref 26.0–34.0)
MCHC: 34.6 g/dL (ref 30.0–36.0)
MCV: 90.7 fL (ref 80.0–100.0)
Monocytes Absolute: 0.7 10*3/uL (ref 0.1–1.0)
Monocytes Relative: 11 %
Neutro Abs: 4.1 10*3/uL (ref 1.7–7.7)
Neutrophils Relative %: 63 %
Platelet Count: 115 10*3/uL — ABNORMAL LOW (ref 150–400)
RBC: 5.07 MIL/uL (ref 4.22–5.81)
RDW: 12 % (ref 11.5–15.5)
WBC Count: 6.6 10*3/uL (ref 4.0–10.5)
nRBC: 0 % (ref 0.0–0.2)

## 2023-05-26 LAB — BASIC METABOLIC PANEL - CANCER CENTER ONLY
Anion gap: 10 (ref 5–15)
BUN: 27 mg/dL — ABNORMAL HIGH (ref 8–23)
CO2: 26 mmol/L (ref 22–32)
Calcium: 8.6 mg/dL — ABNORMAL LOW (ref 8.9–10.3)
Chloride: 102 mmol/L (ref 98–111)
Creatinine: 0.85 mg/dL (ref 0.61–1.24)
GFR, Estimated: >60 mL/min (ref >60)
Glucose, Bld: 140 mg/dL — ABNORMAL HIGH (ref 70–99)
Potassium: 3.9 mmol/L (ref 3.5–5.1)
Sodium: 138 mmol/L (ref 135–145)

## 2023-05-26 LAB — MAGNESIUM: Magnesium: 2 mg/dL (ref 1.7–2.4)

## 2023-05-26 MED ORDER — SODIUM CHLORIDE 0.9 % IV SOLN
INTRAVENOUS | Status: DC
  Filled 2023-05-26: qty 250

## 2023-05-26 MED ORDER — POTASSIUM CHLORIDE IN NACL 20-0.9 MEQ/L-% IV SOLN
Freq: Once | INTRAVENOUS | Status: AC
  Filled 2023-05-26: qty 1000

## 2023-05-26 MED ORDER — DEXAMETHASONE SODIUM PHOSPHATE 10 MG/ML IJ SOLN
10.0000 mg | Freq: Once | INTRAMUSCULAR | Status: AC
  Administered 2023-05-26: 10 mg via INTRAVENOUS
  Filled 2023-05-26: qty 1

## 2023-05-26 MED ORDER — SODIUM CHLORIDE 0.9% FLUSH
10.0000 mL | INTRAVENOUS | Status: DC | PRN
  Administered 2023-05-26: 10 mL via INTRAVENOUS
  Filled 2023-05-26: qty 10

## 2023-05-26 MED ORDER — SODIUM CHLORIDE 0.9 % IV SOLN
150.0000 mg | Freq: Once | INTRAVENOUS | Status: AC
  Administered 2023-05-26: 150 mg via INTRAVENOUS
  Filled 2023-05-26: qty 150

## 2023-05-26 MED ORDER — SODIUM CHLORIDE 0.9 % IV SOLN
40.0000 mg/m2 | Freq: Once | INTRAVENOUS | Status: AC
  Administered 2023-05-26: 85 mg via INTRAVENOUS
  Filled 2023-05-26: qty 85

## 2023-05-26 MED ORDER — PALONOSETRON HCL INJECTION 0.25 MG/5ML
0.2500 mg | Freq: Once | INTRAVENOUS | Status: AC
  Administered 2023-05-26: 0.25 mg via INTRAVENOUS
  Filled 2023-05-26: qty 5

## 2023-05-26 MED ORDER — MAGNESIUM SULFATE 2 GM/50ML IV SOLN
2.0000 g | Freq: Once | INTRAVENOUS | Status: AC
  Administered 2023-05-26: 2 g via INTRAVENOUS
  Filled 2023-05-26: qty 50

## 2023-05-26 MED ORDER — HEPARIN SOD (PORK) LOCK FLUSH 100 UNIT/ML IV SOLN
500.0000 [IU] | Freq: Once | INTRAVENOUS | Status: AC | PRN
  Administered 2023-05-26: 500 [IU]
  Filled 2023-05-26: qty 5

## 2023-05-26 NOTE — Progress Notes (Signed)
Nutrition Follow-up:  Patient with SCC of left tonsil, HPV+.  Patient receiving concurrent radiation and chemotherapy.    Attempted to meet with patient during infusion today.  Patient with eyes closed when RD approached chair.  RD spoke to patient and touched patient.  Patient stated "I don't want to talk" in angry tone and turned over in chair away from RD.    Medications: reviewed  Labs: reviewed  Anthropometrics:   Weight 174 lb 9.7 oz today  184 lb 6.4 oz  192 lb on 1/7 200 lb on 11/9  13% weight loss in the last 2 1/2 months  NUTRITION DIAGNOSIS: Predicted sub optimal energy intake continues   MALNUTRITION DIAGNOSIS: Likely with weight loss   INTERVENTION:  Patient to contact RD if needed     NEXT VISIT: no follow-up RD available as needed  Royetta Probus B. Freida Busman, RD, LDN Registered Dietitian [phone]

## 2023-05-26 NOTE — Patient Instructions (Signed)
[facility] - A DEPT OF [facility]  Discharge Instructions: Thank you for choosing [facility] to provide your oncology and hematology care.  If you have a lab appointment with the Cancer Center, please go directly to the Cancer Center and check in at the registration area.  Wear comfortable clothing and clothing appropriate for easy access to any Portacath or PICC line.   We strive to give you quality time with your provider. You may need to reschedule your appointment if you arrive late (15 or more minutes).  Arriving late affects you and other patients whose appointments are after yours.  Also, if you miss three or more appointments without notifying the office, you may be dismissed from the clinic at the provider's discretion.      For prescription refill requests, have your pharmacy contact our office and allow 72 hours for refills to be completed.    Today you received the following chemotherapy and/or immunotherapy agents CISPLATIN      To help prevent nausea and vomiting after your treatment, we encourage you to take your nausea medication as directed.  BELOW ARE SYMPTOMS THAT SHOULD BE REPORTED IMMEDIATELY: *FEVER GREATER THAN 100.4 F (38 C) OR HIGHER *CHILLS OR SWEATING *NAUSEA AND VOMITING THAT IS NOT CONTROLLED WITH YOUR NAUSEA MEDICATION *UNUSUAL SHORTNESS OF BREATH *UNUSUAL BRUISING OR BLEEDING *URINARY PROBLEMS (pain or burning when urinating, or frequent urination) *BOWEL PROBLEMS (unusual diarrhea, constipation, pain near the anus) TENDERNESS IN MOUTH AND THROAT WITH OR WITHOUT PRESENCE OF ULCERS (sore throat, sores in mouth, or a toothache) UNUSUAL RASH, SWELLING OR PAIN  UNUSUAL VAGINAL DISCHARGE OR ITCHING   Items with * indicate a potential emergency and should be followed up as soon as possible or go to the Emergency Department if any problems should occur.  Please show the CHEMOTHERAPY ALERT CARD or IMMUNOTHERAPY  ALERT CARD at check-in to the Emergency Department and triage nurse.  Should you have questions after your visit or need to cancel or reschedule your appointment, please contact [facility] - A DEPT OF [facility]  [phone] and follow the prompts.  Office hours are 8:00 a.m. to 4:30 p.m. Monday - Friday. Please note that voicemails left after 4:00 p.m. may not be returned until the following business day.  We are closed weekends and major holidays. You have access to a nurse at all times for urgent questions. Please call the main number to the clinic [phone] and follow the prompts.  For any non-urgent questions, you may also contact your provider using MyChart. We now offer e-Visits for anyone 79 and older to request care online for non-urgent symptoms. For details visit [email or web address].   Also download the MyChart app! Go to the app store, search "MyChart", open the app, select [place], and log in with your MyChart username and password.  Cisplatin Injection What is this medication? CISPLATIN (SIS pla tin) treats some types of cancer. It works by slowing down the growth of cancer cells. This medicine may be used for other purposes; ask your health care provider or pharmacist if you have questions. COMMON BRAND NAME(S): Platinol, Platinol -AQ What should I tell my care team before I take this medication? They need to know if you have any of these conditions: Eye disease, vision problems Hearing problems Kidney disease Low blood counts, such as low white cells, platelets, or red blood cells Tingling of the fingers  or toes, or other nerve disorder An unusual or allergic reaction to cisplatin, carboplatin, oxaliplatin, other medications, foods, dyes, or preservatives If you or your partner are pregnant or trying to get pregnant Breast-feeding How should I use this medication? This medication is injected into a vein. It is given by your care  team in a hospital or clinic setting. Talk to your care team about the use of this medication in children. Special care may be needed. Overdosage: If you think you have taken too much of this medicine contact a poison control center or emergency room at once. NOTE: This medicine is only for you. Do not share this medicine with others. What if I miss a dose? Keep appointments for follow-up doses. It is important not to miss your dose. Call your care team if you are unable to keep an appointment. What may interact with this medication? Do not take this medication with any of the following: Live virus vaccines This medication may also interact with the following: Certain antibiotics, such as amikacin, gentamicin, neomycin, polymyxin B, streptomycin, tobramycin, vancomycin Foscarnet This list may not describe all possible interactions. Give your health care provider a list of all the medicines, herbs, non-prescription drugs, or dietary supplements you use. Also tell them if you smoke, drink alcohol, or use illegal drugs. Some items may interact with your medicine. What should I watch for while using this medication? Your condition will be monitored carefully while you are receiving this medication. You may need blood work done while taking this medication. This medication may make you feel generally unwell. This is not uncommon, as chemotherapy can affect healthy cells as well as cancer cells. Report any side effects. Continue your course of treatment even though you feel ill unless your care team tells you to stop. This medication may increase your risk of getting an infection. Call your care team for advice if you get a fever, chills, sore throat, or other symptoms of a cold or flu. Do not treat yourself. Try to avoid being around people who are sick. Avoid taking medications that contain aspirin, acetaminophen, ibuprofen, naproxen, or ketoprofen unless instructed by your care team. These medications  may hide a fever. This medication may increase your risk to bruise or bleed. Call your care team if you notice any unusual bleeding. Be careful brushing or flossing your teeth or using a toothpick because you may get an infection or bleed more easily. If you have any dental work done, tell your dentist you are receiving this medication. Drink fluids as directed while you are taking this medication. This will help protect your kidneys. Call your care team if you get diarrhea. Do not treat yourself. Talk to your care team if you or your partner wish to become pregnant or think you might be pregnant. This medication can cause serious birth defects if taken during pregnancy and for 14 months after the last dose. A negative pregnancy test is required before starting this medication. A reliable form of contraception is recommended while taking this medication and for 14 months after the last dose. Talk to your care team about effective forms of contraception. Do not father a child while taking this medication and for 11 months after the last dose. Use a condom during sex during this time period. Do not breast-feed while taking this medication. This medication may cause infertility. Talk to your care team if you are concerned about your fertility. What side effects may I notice from receiving this medication? Side  effects that you should report to your care team as soon as possible: Allergic reactions--skin rash, itching, hives, swelling of the face, lips, tongue, or throat Eye pain, change in vision, vision loss Hearing loss, ringing in ears Infection--fever, chills, cough, sore throat, wounds that don't heal, pain or trouble when passing urine, general feeling of discomfort or being unwell Kidney injury--decrease in the amount of urine, swelling of the ankles, hands, or feet Low red blood cell level--unusual weakness or fatigue, dizziness, headache, trouble breathing Painful swelling, warmth, or redness  of the skin, blisters or sores at the infusion site Pain, tingling, or numbness in the hands or feet Unusual bruising or bleeding Side effects that usually do not require medical attention (report to your care team if they continue or are bothersome): Hair loss Nausea Vomiting This list may not describe all possible side effects. Call your doctor for medical advice about side effects. You may report side effects to FDA at 1-800-FDA-1088. Where should I keep my medication? This medication is given in a hospital or clinic. It will not be stored at home. NOTE: This sheet is a summary. It may not cover all possible information. If you have questions about this medicine, talk to your doctor, pharmacist, or health care provider.  2024 Elsevier/Gold Standard (2021-08-15 00:00:00)

## 2023-05-27 ENCOUNTER — Other Ambulatory Visit: Payer: Self-pay

## 2023-05-27 ENCOUNTER — Ambulatory Visit
Admission: RE | Admit: 2023-05-27 | Discharge: 2023-05-27 | Disposition: A | Discharge status: Home / Self Care | Payer: Medicare Other | Source: Ambulatory Visit | Attending: Radiation Oncology | Admitting: Radiation Oncology

## 2023-05-27 DIAGNOSIS — Z510 Encounter for antineoplastic radiation therapy: Secondary | ICD-10-CM | POA: Diagnosis not present

## 2023-05-27 LAB — RAD ONC ARIA SESSION SUMMARY
Course Elapsed Days: 24
Plan Fractions Treated to Date: 19
Plan Prescribed Dose Per Fraction: 2 Gy
Plan Total Fractions Prescribed: 35
Plan Total Prescribed Dose: 70 Gy
Reference Point Dosage Given to Date: 38 Gy
Reference Point Session Dosage Given: 2 Gy
Session Number: 19

## 2023-05-28 ENCOUNTER — Ambulatory Visit
Admission: RE | Admit: 2023-05-28 | Discharge: 2023-05-28 | Disposition: A | Discharge status: Home / Self Care | Payer: Medicare Other | Source: Ambulatory Visit | Attending: Radiation Oncology | Admitting: Radiation Oncology

## 2023-05-28 ENCOUNTER — Other Ambulatory Visit: Payer: Self-pay

## 2023-05-28 ENCOUNTER — Ambulatory Visit: Payer: Medicare Other | Admitting: Speech Pathology

## 2023-05-28 DIAGNOSIS — Z510 Encounter for antineoplastic radiation therapy: Secondary | ICD-10-CM | POA: Diagnosis not present

## 2023-05-28 LAB — RAD ONC ARIA SESSION SUMMARY
Course Elapsed Days: 25
Plan Fractions Treated to Date: 20
Plan Prescribed Dose Per Fraction: 2 Gy
Plan Total Fractions Prescribed: 35
Plan Total Prescribed Dose: 70 Gy
Reference Point Dosage Given to Date: 40 Gy
Reference Point Session Dosage Given: 2 Gy
Session Number: 20

## 2023-05-31 ENCOUNTER — Other Ambulatory Visit: Payer: Self-pay

## 2023-05-31 ENCOUNTER — Ambulatory Visit
Admission: RE | Admit: 2023-05-31 | Discharge: 2023-05-31 | Disposition: A | Discharge status: Home / Self Care | Payer: Medicare Other | Source: Ambulatory Visit | Attending: Radiation Oncology | Admitting: Radiation Oncology

## 2023-05-31 DIAGNOSIS — Z510 Encounter for antineoplastic radiation therapy: Secondary | ICD-10-CM | POA: Insufficient documentation

## 2023-05-31 DIAGNOSIS — C099 Malignant neoplasm of tonsil, unspecified: Secondary | ICD-10-CM | POA: Diagnosis present

## 2023-05-31 LAB — RAD ONC ARIA SESSION SUMMARY
Course Elapsed Days: 28
Plan Fractions Treated to Date: 21
Plan Prescribed Dose Per Fraction: 2 Gy
Plan Total Fractions Prescribed: 35
Plan Total Prescribed Dose: 70 Gy
Reference Point Dosage Given to Date: 42 Gy
Reference Point Session Dosage Given: 2 Gy
Session Number: 21

## 2023-05-31 MED FILL — Fosaprepitant Dimeglumine For IV Infusion 150 MG (Base Eq): INTRAVENOUS | Qty: 5 | Status: AC

## 2023-06-01 ENCOUNTER — Other Ambulatory Visit: Payer: Self-pay

## 2023-06-01 ENCOUNTER — Inpatient Hospital Stay (HOSPITAL_BASED_OUTPATIENT_CLINIC_OR_DEPARTMENT_OTHER): Payer: Medicare Other | Admitting: Oncology

## 2023-06-01 ENCOUNTER — Inpatient Hospital Stay: Payer: Medicare Other

## 2023-06-01 ENCOUNTER — Ambulatory Visit
Admission: RE | Admit: 2023-06-01 | Discharge: 2023-06-01 | Disposition: A | Discharge status: Home / Self Care | Payer: Medicare Other | Source: Ambulatory Visit | Attending: Radiation Oncology | Admitting: Radiation Oncology

## 2023-06-01 ENCOUNTER — Encounter: Payer: Self-pay | Admitting: Oncology

## 2023-06-01 VITALS — BP 131/79 | HR 81 | Temp 95.0°F | Resp 19

## 2023-06-01 VITALS — BP 81/72 | HR 88 | Temp 96.6°F | Resp 18 | Wt 174.7 lb

## 2023-06-01 DIAGNOSIS — R5383 Other fatigue: Secondary | ICD-10-CM | POA: Insufficient documentation

## 2023-06-01 DIAGNOSIS — Z87891 Personal history of nicotine dependence: Secondary | ICD-10-CM | POA: Insufficient documentation

## 2023-06-01 DIAGNOSIS — I2510 Atherosclerotic heart disease of native coronary artery without angina pectoris: Secondary | ICD-10-CM | POA: Insufficient documentation

## 2023-06-01 DIAGNOSIS — D6959 Other secondary thrombocytopenia: Secondary | ICD-10-CM | POA: Insufficient documentation

## 2023-06-01 DIAGNOSIS — C109 Malignant neoplasm of oropharynx, unspecified: Secondary | ICD-10-CM

## 2023-06-01 DIAGNOSIS — I252 Old myocardial infarction: Secondary | ICD-10-CM | POA: Insufficient documentation

## 2023-06-01 DIAGNOSIS — R630 Anorexia: Secondary | ICD-10-CM | POA: Insufficient documentation

## 2023-06-01 DIAGNOSIS — T451X5A Adverse effect of antineoplastic and immunosuppressive drugs, initial encounter: Secondary | ICD-10-CM

## 2023-06-01 DIAGNOSIS — R634 Abnormal weight loss: Secondary | ICD-10-CM | POA: Insufficient documentation

## 2023-06-01 DIAGNOSIS — C770 Secondary and unspecified malignant neoplasm of lymph nodes of head, face and neck: Secondary | ICD-10-CM | POA: Insufficient documentation

## 2023-06-01 DIAGNOSIS — K1233 Oral mucositis (ulcerative) due to radiation: Secondary | ICD-10-CM | POA: Insufficient documentation

## 2023-06-01 DIAGNOSIS — F1411 Cocaine abuse, in remission: Secondary | ICD-10-CM | POA: Insufficient documentation

## 2023-06-01 DIAGNOSIS — Z5111 Encounter for antineoplastic chemotherapy: Secondary | ICD-10-CM | POA: Insufficient documentation

## 2023-06-01 DIAGNOSIS — Z510 Encounter for antineoplastic radiation therapy: Secondary | ICD-10-CM | POA: Diagnosis not present

## 2023-06-01 DIAGNOSIS — I10 Essential (primary) hypertension: Secondary | ICD-10-CM | POA: Insufficient documentation

## 2023-06-01 LAB — RAD ONC ARIA SESSION SUMMARY
Course Elapsed Days: 29
Plan Fractions Treated to Date: 22
Plan Prescribed Dose Per Fraction: 2 Gy
Plan Total Fractions Prescribed: 35
Plan Total Prescribed Dose: 70 Gy
Reference Point Dosage Given to Date: 44 Gy
Reference Point Session Dosage Given: 2 Gy
Session Number: 22

## 2023-06-01 LAB — CBC WITH DIFFERENTIAL (CANCER CENTER ONLY)
Abs Immature Granulocytes: 0.02 10*3/uL (ref 0.00–0.07)
Basophils Absolute: 0 10*3/uL (ref 0.0–0.1)
Basophils Relative: 0 %
Eosinophils Absolute: 0 10*3/uL (ref 0.0–0.5)
Eosinophils Relative: 0 %
HCT: 41.7 % (ref 39.0–52.0)
Hemoglobin: 14.6 g/dL (ref 13.0–17.0)
Immature Granulocytes: 0 %
Lymphocytes Relative: 26 %
Lymphs Abs: 1.4 10*3/uL (ref 0.7–4.0)
MCH: 31.5 pg (ref 26.0–34.0)
MCHC: 35 g/dL (ref 30.0–36.0)
MCV: 90.1 fL (ref 80.0–100.0)
Monocytes Absolute: 0.6 10*3/uL (ref 0.1–1.0)
Monocytes Relative: 11 %
Neutro Abs: 3.3 10*3/uL (ref 1.7–7.7)
Neutrophils Relative %: 63 %
Platelet Count: 86 10*3/uL — ABNORMAL LOW (ref 150–400)
RBC: 4.63 MIL/uL (ref 4.22–5.81)
RDW: 11.9 % (ref 11.5–15.5)
WBC Count: 5.3 10*3/uL (ref 4.0–10.5)
nRBC: 0 % (ref 0.0–0.2)

## 2023-06-01 LAB — BASIC METABOLIC PANEL - CANCER CENTER ONLY
Anion gap: 11 (ref 5–15)
BUN: 33 mg/dL — ABNORMAL HIGH (ref 8–23)
CO2: 25 mmol/L (ref 22–32)
Calcium: 8.1 mg/dL — ABNORMAL LOW (ref 8.9–10.3)
Chloride: 99 mmol/L (ref 98–111)
Creatinine: 1.06 mg/dL (ref 0.61–1.24)
GFR, Estimated: >60 mL/min (ref >60)
Glucose, Bld: 173 mg/dL — ABNORMAL HIGH (ref 70–99)
Potassium: 3.9 mmol/L (ref 3.5–5.1)
Sodium: 135 mmol/L (ref 135–145)

## 2023-06-01 LAB — MAGNESIUM: Magnesium: 1.7 mg/dL (ref 1.7–2.4)

## 2023-06-01 MED ORDER — HEPARIN SOD (PORK) LOCK FLUSH 100 UNIT/ML IV SOLN
500.0000 [IU] | Freq: Once | INTRAVENOUS | Status: AC | PRN
  Administered 2023-06-01: 500 [IU]
  Filled 2023-06-01: qty 5

## 2023-06-01 MED ORDER — POTASSIUM CHLORIDE IN NACL 20-0.9 MEQ/L-% IV SOLN
Freq: Once | INTRAVENOUS | Status: AC
  Filled 2023-06-01: qty 1000

## 2023-06-01 MED ORDER — SODIUM CHLORIDE 0.9 % IV SOLN
INTRAVENOUS | Status: DC
Start: 2023-06-01 — End: 2023-06-01
  Filled 2023-06-01: qty 250

## 2023-06-01 MED ORDER — DEXAMETHASONE SODIUM PHOSPHATE 10 MG/ML IJ SOLN
10.0000 mg | Freq: Once | INTRAMUSCULAR | Status: AC
  Administered 2023-06-01: 10 mg via INTRAVENOUS
  Filled 2023-06-01: qty 1

## 2023-06-01 MED ORDER — MAGNESIUM SULFATE 2 GM/50ML IV SOLN
2.0000 g | Freq: Once | INTRAVENOUS | Status: AC
  Administered 2023-06-01: 2 g via INTRAVENOUS
  Filled 2023-06-01: qty 50

## 2023-06-01 MED ORDER — FOSAPREPITANT DIMEGLUMINE INJECTION 150 MG
150.0000 mg | Freq: Once | INTRAVENOUS | Status: AC
  Administered 2023-06-01: 150 mg via INTRAVENOUS
  Filled 2023-06-01: qty 150

## 2023-06-01 MED ORDER — PALONOSETRON HCL INJECTION 0.25 MG/5ML
0.2500 mg | Freq: Once | INTRAVENOUS | Status: AC
  Administered 2023-06-01: 0.25 mg via INTRAVENOUS
  Filled 2023-06-01: qty 5

## 2023-06-01 MED ORDER — SODIUM CHLORIDE 0.9 % IV SOLN
40.0000 mg/m2 | Freq: Once | INTRAVENOUS | Status: AC
  Administered 2023-06-01: 85 mg via INTRAVENOUS
  Filled 2023-06-01: qty 85

## 2023-06-01 NOTE — Progress Notes (Signed)
 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  Hypermetabolic left level 2 lymph nodes measuring up to 1.4 cm with an SUV of 5.6.  No additional areas of hypermetabolism.  CT Neck (02/15/2023) showed 3.4 x 2.3 x 2.7 cm enhancing mass centered in the left tonsillar fossa with possible extension to the left glossotonsillar sulcus and the left retromolar trigone and inferior extent into the left piriform sinus. There is a defect in the left posterior mandibular body, which may be related to recent tooth extraction, however it would be difficult to exclude the possibility of tumor involvement in this region. --Metastatic left level 2A conglomerate with additional suspicious left level 3 lymphadenopathy. Clustered, subcentimeter nodes in the left supraclavicular region are indeterminate but suspicious.      Patient underwent left tonsillar biopsy which was consistent with HPV positive squamous cell carcinoma.  Left glossotonsillar sulcus biopsy was also consistent with HPV positive SCC.  Patient was seen by Dr. Delilah from [facility] and he was recommended concurrent chemoradiation with weekly  cisplatin  40 mg/m.  There was possible concern for left supraclavicular lymph node involvement as per discussion in [facility] although it was not noted overtly on PET scan   Patient reports mild pain in his left neck but denies any difficulty swallowing.  States that he has not gone back to any polysubstance abuse presently.  He would like to get his care locally here at [facility].  Interval history-he is tolerating radiation treatments well so far.  Reports ongoing fatigue.  He is using baking soda mouthwashes which is helping him with his mucositis which remains mild.  He is eating 3 meals a day.  He has however last 14 pounds in the last 1 month.  ECOG PS- 1 Pain scale- 2 Opioid associated constipation- no  Review of systems- Review of Systems  Constitutional:  Negative for chills, fever, malaise/fatigue and weight loss.  HENT:  Negative for congestion, ear discharge and nosebleeds.   Eyes:  Negative for blurred vision.  Respiratory:  Negative for cough, hemoptysis, sputum production, shortness of breath and wheezing.   Cardiovascular:  Negative for chest pain, palpitations, orthopnea and claudication.  Gastrointestinal:  Negative for abdominal pain, blood in stool, constipation, diarrhea, heartburn, melena, nausea and vomiting.  Genitourinary:  Negative for dysuria, flank pain, frequency, hematuria and urgency.  Musculoskeletal:  Negative for back pain, joint pain and myalgias.  Skin:  Negative for rash.  Neurological:  Negative for dizziness, tingling, focal weakness, seizures, weakness and headaches.  Endo/Heme/Allergies:  Does not bruise/bleed easily.  Psychiatric/Behavioral:  Negative for depression and suicidal ideas. The patient does not have insomnia.       Allergies  Allergen Reactions   Citalopram  Rash    Other reaction(s): broke out (he thinks, possibly from dye on generic?) prescribed by [facility] Jan 2013   Misc. Sulfonamide Containing Compounds     Other reaction(s): Do not  use as he has been using crack cocaine   Other     Beta blockers     Past Medical History:  Diagnosis Date   Anxiety    Asthma    GERD (gastroesophageal reflux disease)    Hypertension      Past Surgical History:  Procedure Laterality Date   IR IMAGING GUIDED PORT INSERTION  04/08/2023    Social History   Socioeconomic History   Marital status: Single    Spouse name: Not on file   Number of children: Not on file   Years of education: Not on file   Highest education level: Not on file  Occupational History   Not on file  Tobacco Use   Smoking status: Former    Types: Cigarettes   Smokeless tobacco: Former  Substance and Sexual Activity   Alcohol use: Not Currently    Comment: Quit drinking 15-20 years ago.   Drug use: Not Currently    Types: Crack cocaine    Comment: Stop using being 15-20 yrs ago.   Sexual activity: Not Currently  Other Topics Concern   Not on file  Social History Narrative   Not on file   Social Drivers of Health   Financial Resource Strain: Low Risk  (05/21/2020)   [facility] from [facility]   Overall Financial Resource Strain (CARDIA)    Difficulty of Paying Living Expenses: Not very hard  Food Insecurity: No Food Insecurity (05/04/2023)   Hunger Vital Sign    Worried About Running Out of Food in the Last Year: Never true    Ran Out of Food in the Last Year: Never true  Transportation Needs: No Transportation Needs (05/04/2023)   PRAPARE - Administrator, Civil Service (Medical): No    Lack of Transportation (Non-Medical): No  Physical Activity: Inactive (05/04/2023)   Exercise Vital Sign    Days of Exercise per Week: 0 days    Minutes of Exercise per Session: 0 min  Stress: Not on file  Social Connections: Unknown (05/04/2023)   Social Connection and Isolation Panel [NHANES]    Frequency of Communication with Friends and Family: Three times a week    Frequency of Social Gatherings with Friends and Family:  Three times a week    Attends Religious Services: More than 4 times per year    Active Member of Clubs or Organizations: No    Attends Banker Meetings: Never    Marital Status: Not on file  Intimate Partner Violence: Not At Risk (05/04/2023)   Humiliation, Afraid, Rape, and Kick questionnaire    Fear of Current or Ex-Partner: No    Emotionally Abused: No    Physically Abused: No    Sexually Abused: No    Family History  Problem Relation Age of Onset   Aneurysm Father    Cancer Sister    Lung cancer Paternal Aunt    Lung cancer Paternal Uncle    Pneumonia Paternal Grandfather      Current Outpatient Medications:    albuterol (VENTOLIN HFA) 108 (90 Base) MCG/ACT inhaler, , Disp: , Rfl:    amLODipine (NORVASC) 10 MG tablet, Take 1 tablet by mouth daily., Disp: , Rfl:    aspirin  81 MG chewable tablet, , Disp: ,  Rfl:    dexamethasone  (DECADRON ) 4 MG tablet, Take 2 tablets (8 mg) by mouth daily x 3 days starting the day after cisplatin  chemotherapy. Take with food., Disp: 30 tablet, Rfl: 1   lidocaine -prilocaine  (EMLA ) cream, Apply to affected area once, Disp: 30 g, Rfl: 3   lisinopril (ZESTRIL) 5 MG tablet, , Disp: , Rfl:    magic mouthwash w/lidocaine  SOLN, Take 5 mLs by mouth 4 (four) times daily as needed for mouth pain. Sig: Swish/Swallow 5-10 ml four times a day as needed. Dispense 480 ml. 1RF, Disp: 480 mL, Rfl: 1   metFORMIN (GLUCOPHAGE-XR) 500 MG 24 hr tablet, , Disp: , Rfl:    nitroGLYCERIN (NITROSTAT) 0.4 MG SL tablet, Place under the tongue. (Patient not taking: Reported on 05/04/2023), Disp: , Rfl:    ondansetron  (ZOFRAN ) 8 MG tablet, Take 1 tablet (8 mg total) by mouth every 8 (eight) hours as needed for nausea or vomiting. Start on the third day after cisplatin . (Patient not taking: Reported on 05/04/2023), Disp: 30 tablet, Rfl: 1   prochlorperazine  (COMPAZINE ) 10 MG tablet, Take 1 tablet (10 mg total) by mouth every 6 (six) hours as needed (Nausea or vomiting).  (Patient not taking: Reported on 05/04/2023), Disp: 30 tablet, Rfl: 1   sertraline (ZOLOFT) 25 MG tablet, , Disp: , Rfl:   Physical exam: There were no vitals filed for this visit. Physical Exam HENT:     Mouth/Throat:     Mouth: Mucous membranes are moist.     Pharynx: Oropharynx is clear.  Cardiovascular:     Rate and Rhythm: Normal rate and regular rhythm.     Heart sounds: Normal heart sounds.  Pulmonary:     Effort: Pulmonary effort is normal.     Breath sounds: Normal breath sounds.  Abdominal:     General: Bowel sounds are normal.     Palpations: Abdomen is soft.  Skin:    General: Skin is warm and dry.  Neurological:     Mental Status: He is alert and oriented to person, place, and time.         Latest Ref Rng & Units 06/01/2023    8:30 AM  CMP  Glucose 70 - 99 mg/dL 826   BUN 8 - 23 mg/dL 33   Creatinine 9.38 - 1.24 mg/dL 8.93   Sodium 864 - 854 mmol/L 135   Potassium 3.5 - 5.1 mmol/L 3.9   Chloride 98 - 111 mmol/L 99   CO2 22 - 32 mmol/L 25   Calcium 8.9 - 10.3 mg/dL 8.1       Latest Ref Rng & Units 06/01/2023    8:30 AM  CBC  WBC 4.0 - 10.5 K/uL 5.3   Hemoglobin 13.0 - 17.0 g/dL 85.3   Hematocrit 60.9 - 52.0 % 41.7   Platelets 150 - 400 K/uL 86      Assessment and plan- Patient is a 67 y.o. male  with history of stage I squamous cell carcinoma of the oropharynx T2 N1 M0.  He is here for on treatment assessment prior to cycle 5 of weekly cisplatin  chemotherapy  Counts okay to proceed with cycle 5 of weekly cisplatin  chemotherapy today.  He will directly proceed for cycle 6 next week and I will see him back in 2 weeks for cycle 7 which would be his last treatment.  Plan to repeat PET CT scan in about 10 weeks post completion of chemoradiation.  Radiation mucositis: Overall mild grade 1.  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   Visit Diagnosis 1. Encounter  for antineoplastic chemotherapy   2. Squamous cell carcinoma of oropharynx (HCC)   3. Chemotherapy-induced thrombocytopenia      Dr. Annah Skene, MD, MPH [facility] [phone] 06/01/2023 8:39 AM

## 2023-06-01 NOTE — Patient Instructions (Signed)
 [facility] - A DEPT OF [facility]  Discharge Instructions: Thank you for choosing [facility] to provide your oncology and hematology care.  If you have a lab appointment with the Cancer Center, please go directly to the Cancer Center and check in at the registration area.  Wear comfortable clothing and clothing appropriate for easy access to any Portacath or PICC line.   We strive to give you quality time with your provider. You may need to reschedule your appointment if you arrive late (15 or more minutes).  Arriving late affects you and other patients whose appointments are after yours.  Also, if you miss three or more appointments without notifying the office, you may be dismissed from the clinic at the provider's discretion.      For prescription refill requests, have your pharmacy contact our office and allow 72 hours for refills to be completed.    Today you received the following chemotherapy and/or immunotherapy agents- cisplatin      To help prevent nausea and vomiting after your treatment, we encourage you to take your nausea medication as directed.  BELOW ARE SYMPTOMS THAT SHOULD BE REPORTED IMMEDIATELY: *FEVER GREATER THAN 100.4 F (38 C) OR HIGHER *CHILLS OR SWEATING *NAUSEA AND VOMITING THAT IS NOT CONTROLLED WITH YOUR NAUSEA MEDICATION *UNUSUAL SHORTNESS OF BREATH *UNUSUAL BRUISING OR BLEEDING *URINARY PROBLEMS (pain or burning when urinating, or frequent urination) *BOWEL PROBLEMS (unusual diarrhea, constipation, pain near the anus) TENDERNESS IN MOUTH AND THROAT WITH OR WITHOUT PRESENCE OF ULCERS (sore throat, sores in mouth, or a toothache) UNUSUAL RASH, SWELLING OR PAIN  UNUSUAL VAGINAL DISCHARGE OR ITCHING   Items with * indicate a potential emergency and should be followed up as soon as possible or go to the Emergency Department if any problems should occur.  Please show the CHEMOTHERAPY ALERT CARD or IMMUNOTHERAPY  ALERT CARD at check-in to the Emergency Department and triage nurse.  Should you have questions after your visit or need to cancel or reschedule your appointment, please contact [facility] - A DEPT OF [facility]  [phone] and follow the prompts.  Office hours are 8:00 a.m. to 4:30 p.m. Monday - Friday. Please note that voicemails left after 4:00 p.m. may not be returned until the following business day.  We are closed weekends and major holidays. You have access to a nurse at all times for urgent questions. Please call the main number to the clinic [phone] and follow the prompts.  For any non-urgent questions, you may also contact your provider using MyChart. We now offer e-Visits for anyone 9 and older to request care online for non-urgent symptoms. For details visit [email or web address].   Also download the MyChart app! Go to the app store, search "MyChart", open the app, select , and log in with your MyChart username and password.

## 2023-06-02 ENCOUNTER — Ambulatory Visit
Admission: RE | Admit: 2023-06-02 | Discharge: 2023-06-02 | Disposition: A | Discharge status: Home / Self Care | Payer: Medicare Other | Source: Ambulatory Visit | Attending: Radiation Oncology | Admitting: Radiation Oncology

## 2023-06-02 ENCOUNTER — Other Ambulatory Visit: Payer: Self-pay

## 2023-06-02 DIAGNOSIS — Z510 Encounter for antineoplastic radiation therapy: Secondary | ICD-10-CM | POA: Diagnosis not present

## 2023-06-02 LAB — RAD ONC ARIA SESSION SUMMARY
Course Elapsed Days: 30
Plan Fractions Treated to Date: 23
Plan Prescribed Dose Per Fraction: 2 Gy
Plan Total Fractions Prescribed: 35
Plan Total Prescribed Dose: 70 Gy
Reference Point Dosage Given to Date: 46 Gy
Reference Point Session Dosage Given: 2 Gy
Session Number: 23

## 2023-06-03 ENCOUNTER — Ambulatory Visit
Admission: RE | Admit: 2023-06-03 | Discharge: 2023-06-03 | Disposition: A | Discharge status: Home / Self Care | Payer: Medicare Other | Source: Ambulatory Visit | Attending: Radiation Oncology | Admitting: Radiation Oncology

## 2023-06-03 ENCOUNTER — Other Ambulatory Visit: Payer: Self-pay

## 2023-06-03 DIAGNOSIS — Z510 Encounter for antineoplastic radiation therapy: Secondary | ICD-10-CM | POA: Diagnosis not present

## 2023-06-03 LAB — RAD ONC ARIA SESSION SUMMARY
Course Elapsed Days: 31
Plan Fractions Treated to Date: 24
Plan Prescribed Dose Per Fraction: 2 Gy
Plan Total Fractions Prescribed: 35
Plan Total Prescribed Dose: 70 Gy
Reference Point Dosage Given to Date: 48 Gy
Reference Point Session Dosage Given: 2 Gy
Session Number: 24

## 2023-06-04 ENCOUNTER — Other Ambulatory Visit: Payer: Self-pay

## 2023-06-04 ENCOUNTER — Ambulatory Visit
Admission: RE | Admit: 2023-06-04 | Discharge: 2023-06-04 | Disposition: A | Discharge status: Home / Self Care | Payer: Medicare Other | Source: Ambulatory Visit | Attending: Radiation Oncology | Admitting: Radiation Oncology

## 2023-06-04 ENCOUNTER — Ambulatory Visit: Payer: Medicare Other

## 2023-06-04 DIAGNOSIS — Z510 Encounter for antineoplastic radiation therapy: Secondary | ICD-10-CM | POA: Diagnosis not present

## 2023-06-04 DIAGNOSIS — C109 Malignant neoplasm of oropharynx, unspecified: Secondary | ICD-10-CM

## 2023-06-04 LAB — RAD ONC ARIA SESSION SUMMARY
Course Elapsed Days: 32
Plan Fractions Treated to Date: 25
Plan Prescribed Dose Per Fraction: 2 Gy
Plan Total Fractions Prescribed: 35
Plan Total Prescribed Dose: 70 Gy
Reference Point Dosage Given to Date: 50 Gy
Reference Point Session Dosage Given: 2 Gy
Session Number: 25

## 2023-06-04 MED ORDER — DEXAMETHASONE 4 MG PO TABS: ORAL_TABLET | ORAL | 0 refills | Status: DC

## 2023-06-07 ENCOUNTER — Other Ambulatory Visit: Payer: Self-pay

## 2023-06-07 ENCOUNTER — Ambulatory Visit
Admission: RE | Admit: 2023-06-07 | Discharge: 2023-06-07 | Disposition: A | Discharge status: Home / Self Care | Payer: Medicare Other | Source: Ambulatory Visit | Attending: Radiation Oncology | Admitting: Radiation Oncology

## 2023-06-07 DIAGNOSIS — Z510 Encounter for antineoplastic radiation therapy: Secondary | ICD-10-CM | POA: Diagnosis not present

## 2023-06-07 DIAGNOSIS — C109 Malignant neoplasm of oropharynx, unspecified: Secondary | ICD-10-CM

## 2023-06-07 LAB — RAD ONC ARIA SESSION SUMMARY
Course Elapsed Days: 35
Plan Fractions Treated to Date: 26
Plan Prescribed Dose Per Fraction: 2 Gy
Plan Total Fractions Prescribed: 35
Plan Total Prescribed Dose: 70 Gy
Reference Point Dosage Given to Date: 52 Gy
Reference Point Session Dosage Given: 2 Gy
Session Number: 26

## 2023-06-07 MED ORDER — DEXAMETHASONE 4 MG PO TABS: ORAL_TABLET | ORAL | 0 refills | Status: DC

## 2023-06-07 MED FILL — Fosaprepitant Dimeglumine For IV Infusion 150 MG (Base Eq): INTRAVENOUS | Qty: 5 | Status: AC

## 2023-06-08 ENCOUNTER — Inpatient Hospital Stay: Payer: Medicare Other

## 2023-06-08 ENCOUNTER — Inpatient Hospital Stay (HOSPITAL_BASED_OUTPATIENT_CLINIC_OR_DEPARTMENT_OTHER): Payer: Medicare Other | Admitting: Oncology

## 2023-06-08 ENCOUNTER — Other Ambulatory Visit: Payer: Self-pay

## 2023-06-08 ENCOUNTER — Ambulatory Visit
Admission: RE | Admit: 2023-06-08 | Discharge: 2023-06-08 | Disposition: A | Discharge status: Home / Self Care | Payer: Medicare Other | Source: Ambulatory Visit | Attending: Radiation Oncology | Admitting: Radiation Oncology

## 2023-06-08 ENCOUNTER — Telehealth: Payer: Self-pay

## 2023-06-08 ENCOUNTER — Encounter: Payer: Self-pay | Admitting: Oncology

## 2023-06-08 VITALS — BP 92/73 | HR 94 | Temp 97.6°F | Resp 19 | Wt 169.7 lb

## 2023-06-08 DIAGNOSIS — C109 Malignant neoplasm of oropharynx, unspecified: Secondary | ICD-10-CM

## 2023-06-08 DIAGNOSIS — T451X5A Adverse effect of antineoplastic and immunosuppressive drugs, initial encounter: Secondary | ICD-10-CM | POA: Diagnosis not present

## 2023-06-08 DIAGNOSIS — Z95828 Presence of other vascular implants and grafts: Secondary | ICD-10-CM

## 2023-06-08 DIAGNOSIS — Z510 Encounter for antineoplastic radiation therapy: Secondary | ICD-10-CM | POA: Diagnosis not present

## 2023-06-08 DIAGNOSIS — D6959 Other secondary thrombocytopenia: Secondary | ICD-10-CM | POA: Diagnosis not present

## 2023-06-08 LAB — BASIC METABOLIC PANEL - CANCER CENTER ONLY
Anion gap: 9 (ref 5–15)
BUN: 23 mg/dL (ref 8–23)
CO2: 28 mmol/L (ref 22–32)
Calcium: 8.2 mg/dL — ABNORMAL LOW (ref 8.9–10.3)
Chloride: 98 mmol/L (ref 98–111)
Creatinine: 0.97 mg/dL (ref 0.61–1.24)
GFR, Estimated: >60 mL/min (ref >60)
Glucose, Bld: 142 mg/dL — ABNORMAL HIGH (ref 70–99)
Potassium: 4 mmol/L (ref 3.5–5.1)
Sodium: 135 mmol/L (ref 135–145)

## 2023-06-08 LAB — CBC WITH DIFFERENTIAL (CANCER CENTER ONLY)
Abs Immature Granulocytes: 0.01 10*3/uL (ref 0.00–0.07)
Basophils Absolute: 0 10*3/uL (ref 0.0–0.1)
Basophils Relative: 0 %
Eosinophils Absolute: 0 10*3/uL (ref 0.0–0.5)
Eosinophils Relative: 0 %
HCT: 37.7 % — ABNORMAL LOW (ref 39.0–52.0)
Hemoglobin: 13.1 g/dL (ref 13.0–17.0)
Immature Granulocytes: 0 %
Lymphocytes Relative: 26 %
Lymphs Abs: 1 10*3/uL (ref 0.7–4.0)
MCH: 31.1 pg (ref 26.0–34.0)
MCHC: 34.7 g/dL (ref 30.0–36.0)
MCV: 89.5 fL (ref 80.0–100.0)
Monocytes Absolute: 0.4 10*3/uL (ref 0.1–1.0)
Monocytes Relative: 11 %
Neutro Abs: 2.2 10*3/uL (ref 1.7–7.7)
Neutrophils Relative %: 63 %
Platelet Count: 56 10*3/uL — ABNORMAL LOW (ref 150–400)
RBC: 4.21 MIL/uL — ABNORMAL LOW (ref 4.22–5.81)
RDW: 11.9 % (ref 11.5–15.5)
WBC Count: 3.6 10*3/uL — ABNORMAL LOW (ref 4.0–10.5)
nRBC: 0 % (ref 0.0–0.2)

## 2023-06-08 LAB — RAD ONC ARIA SESSION SUMMARY
Course Elapsed Days: 36
Plan Fractions Treated to Date: 27
Plan Prescribed Dose Per Fraction: 2 Gy
Plan Total Fractions Prescribed: 35
Plan Total Prescribed Dose: 70 Gy
Reference Point Dosage Given to Date: 54 Gy
Reference Point Session Dosage Given: 2 Gy
Session Number: 27

## 2023-06-08 LAB — MAGNESIUM: Magnesium: 1.6 mg/dL — ABNORMAL LOW (ref 1.7–2.4)

## 2023-06-08 MED ORDER — HEPARIN SOD (PORK) LOCK FLUSH 100 UNIT/ML IV SOLN
500.0000 [IU] | Freq: Once | INTRAVENOUS | Status: AC
  Administered 2023-06-08: 500 [IU]
  Filled 2023-06-08: qty 5

## 2023-06-08 MED ORDER — DEXAMETHASONE 4 MG PO TABS: ORAL_TABLET | ORAL | 0 refills | Status: DC

## 2023-06-08 NOTE — Progress Notes (Signed)
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  Hypermetabolic left level 2 lymph nodes measuring up to 1.4 cm with an SUV of 5.6.  No additional areas of hypermetabolism.  CT Neck (02/15/2023) showed 3.4 x 2.3 x 2.7 cm enhancing mass centered in the left tonsillar fossa with possible extension to the left glossotonsillar sulcus and the left retromolar trigone and inferior extent into the left piriform sinus. There is a defect in the left posterior mandibular body, which may be related to recent tooth extraction, however it would be difficult to exclude the possibility of tumor involvement in this region. --Metastatic left level 2A conglomerate with additional suspicious left level 3 lymphadenopathy. Clustered, subcentimeter nodes in the left supraclavicular region are indeterminate but suspicious.      Patient underwent left tonsillar biopsy which was consistent with HPV positive squamous cell carcinoma.  Left glossotonsillar sulcus biopsy was also consistent with HPV positive SCC.  Patient was seen by Dr. Rosana Hoes from [facility] and he was recommended concurrent chemoradiation with weekly  cisplatin 40 mg/m.  There was possible concern for left supraclavicular lymph node involvement as per discussion in [facility] although it was not noted overtly on PET scan   Patient reports mild pain in his left neck but denies any difficulty swallowing.  States that he has not gone back to any polysubstance abuse presently.  He would like to get his care locally here at [facility].  Interval history-has been tolerating radiation well so far without significant mucositis.  He is using baking soda mouth rinses  ECOG PS- 1 Pain scale- 0 Opioid associated constipation- no  Review of systems- Review of Systems  Constitutional:  Positive for malaise/fatigue.      Allergies  Allergen Reactions   Citalopram Rash    Other reaction(s): "broke out" (he thinks, possibly from dye on generic?) prescribed by [facility] Jan 2013   Misc. Sulfonamide Containing Compounds     Other reaction(s): Do not use as he has been using crack cocaine   Other     Beta blockers     Past Medical History:  Diagnosis Date   Anxiety    Asthma    GERD (gastroesophageal reflux disease)    Hypertension      Past Surgical History:  Procedure Laterality Date   IR IMAGING GUIDED PORT INSERTION  04/08/2023    Social History   Socioeconomic History   Marital status: Single    Spouse name: Not on file   Number of children: Not on file   Years of education: Not on file   Highest education level: Not on file  Occupational History   Not on file  Tobacco Use   Smoking status: Former  Types: Cigarettes   Smokeless tobacco: Former  Substance and Sexual Activity   Alcohol use: Not Currently    Comment: Quit drinking 15-20 years ago.   Drug use: Not Currently    Types: "Crack" cocaine    Comment: Stop using being 15-20 yrs ago.   Sexual activity: Not Currently  Other Topics Concern   Not on file  Social History Narrative   Not on file   Social Drivers of Health   Financial Resource Strain: Low Risk   (05/21/2020)   Received from [facility]   Overall Financial Resource Strain (CARDIA)    Difficulty of Paying Living Expenses: Not very hard  Food Insecurity: No Food Insecurity (05/04/2023)   Hunger Vital Sign    Worried About Running Out of Food in the Last Year: Never true    Ran Out of Food in the Last Year: Never true  Transportation Needs: No Transportation Needs (05/04/2023)   PRAPARE - Administrator, Civil Service (Medical): No    Lack of Transportation (Non-Medical): No  Physical Activity: Inactive (05/04/2023)   Exercise Vital Sign    Days of Exercise per Week: 0 days    Minutes of Exercise per Session: 0 min  Stress: Not on file  Social Connections: Unknown (05/04/2023)   Social Connection and Isolation Panel [NHANES]    Frequency of Communication with Friends and Family: Three times a week    Frequency of Social Gatherings with Friends and Family: Three times a week    Attends Religious Services: More than 4 times per year    Active Member of Clubs or Organizations: No    Attends Banker Meetings: Never    Marital Status: Not on file  Intimate Partner Violence: Not At Risk (05/04/2023)   Humiliation, Afraid, Rape, and Kick questionnaire    Fear of Current or Ex-Partner: No    Emotionally Abused: No    Physically Abused: No    Sexually Abused: No    Family History  Problem Relation Age of Onset   Aneurysm Father    Cancer Sister    Lung cancer Paternal Aunt    Lung cancer Paternal Uncle    Pneumonia Paternal Grandfather      Current Outpatient Medications:    albuterol (VENTOLIN HFA) 108 (90 Base) MCG/ACT inhaler, , Disp: , Rfl:    amLODipine (NORVASC) 10 MG tablet, Take 1 tablet by mouth daily., Disp: , Rfl:    aspirin 81 MG chewable tablet, , Disp: , Rfl:    dexamethasone (DECADRON) 4 MG tablet, Take 2 tablets (8 mg) by mouth daily x 3 days starting the day after cisplatin chemotherapy. Take with food., Disp: 12 tablet,  Rfl: 0   lidocaine-prilocaine (EMLA) cream, Apply to affected area once, Disp: 30 g, Rfl: 3   lisinopril (ZESTRIL) 5 MG tablet, , Disp: , Rfl:    magic mouthwash w/lidocaine SOLN, Take 5 mLs by mouth 4 (four) times daily as needed for mouth pain. Sig: Swish/Swallow 5-10 ml four times a day as needed. Dispense 480 ml. 1RF, Disp: 480 mL, Rfl: 1   metFORMIN (GLUCOPHAGE-XR) 500 MG 24 hr tablet, , Disp: , Rfl:    nitroGLYCERIN (NITROSTAT) 0.4 MG SL tablet, Place under the tongue., Disp: , Rfl:    ondansetron (ZOFRAN) 8 MG tablet, Take 1 tablet (8 mg total) by mouth every 8 (eight) hours as needed for nausea or vomiting. Start on the third day after cisplatin., Disp: 30  tablet, Rfl: 1   prochlorperazine (COMPAZINE) 10 MG tablet, Take 1 tablet (10 mg total) by mouth every 6 (six) hours as needed (Nausea or vomiting)., Disp: 30 tablet, Rfl: 1   sertraline (ZOLOFT) 25 MG tablet, , Disp: , Rfl:   Physical exam:  Vitals:   06/08/23 0954  BP: 92/73  Pulse: 94  Resp: 19  Temp: 97.6 F (36.4 C)  SpO2: 100%  Weight: 169 lb 11.2 oz (77 kg)   Physical Exam HENT:     Mouth/Throat:     Mouth: Mucous membranes are moist.     Pharynx: Oropharynx is clear.  Cardiovascular:     Rate and Rhythm: Normal rate and regular rhythm.     Heart sounds: Normal heart sounds.  Pulmonary:     Effort: Pulmonary effort is normal.     Breath sounds: Normal breath sounds.  Abdominal:     General: Bowel sounds are normal.     Palpations: Abdomen is soft.  Skin:    General: Skin is warm and dry.  Neurological:     Mental Status: He is alert and oriented to person, place, and time.         Latest Ref Rng & Units 06/08/2023    9:35 AM  CMP  Glucose 70 - 99 mg/dL 914   BUN 8 - 23 mg/dL 23   Creatinine 7.82 - 1.24 mg/dL 9.56   Sodium 213 - 086 mmol/L 135   Potassium 3.5 - 5.1 mmol/L 4.0   Chloride 98 - 111 mmol/L 98   CO2 22 - 32 mmol/L 28   Calcium 8.9 - 10.3 mg/dL 8.2       Latest Ref Rng & Units  06/08/2023    9:35 AM  CBC  WBC 4.0 - 10.5 K/uL 3.6   Hemoglobin 13.0 - 17.0 g/dL 57.8   Hematocrit 46.9 - 52.0 % 37.7   Platelets 150 - 400 K/uL 56      Assessment and plan- Patient is a 68 y.o. male with history of stage I squamous cell carcinoma of the oropharynx T2 N1 M0.  He is here for on treatment assessment prior to cycle 6 of weekly cisplatin chemotherapy  Platelet counts were 86 last week but are further down to 56 today.  I am planning to hold off on giving him cisplatin this week.  If platelet counts are greater than 75 next week he will go on to receive cycle 6 next week.  Radiation-induced mucositis: Mild and overall stable.   Visit Diagnosis 1. Squamous cell carcinoma of oropharynx (HCC)   2. Chemotherapy-induced thrombocytopenia      Dr. Owens Shark, MD, MPH [facility] [phone] 06/08/2023 12:43 PM

## 2023-06-09 ENCOUNTER — Other Ambulatory Visit: Payer: Self-pay | Admitting: *Deleted

## 2023-06-09 ENCOUNTER — Other Ambulatory Visit: Payer: Self-pay

## 2023-06-09 ENCOUNTER — Ambulatory Visit
Admission: RE | Admit: 2023-06-09 | Discharge: 2023-06-09 | Disposition: A | Discharge status: Home / Self Care | Payer: Medicare Other | Source: Ambulatory Visit | Attending: Radiation Oncology | Admitting: Radiation Oncology

## 2023-06-09 DIAGNOSIS — Z510 Encounter for antineoplastic radiation therapy: Secondary | ICD-10-CM | POA: Diagnosis not present

## 2023-06-09 LAB — RAD ONC ARIA SESSION SUMMARY
Course Elapsed Days: 37
Plan Fractions Treated to Date: 28
Plan Prescribed Dose Per Fraction: 2 Gy
Plan Total Fractions Prescribed: 35
Plan Total Prescribed Dose: 70 Gy
Reference Point Dosage Given to Date: 56 Gy
Reference Point Session Dosage Given: 2 Gy
Session Number: 28

## 2023-06-09 MED ORDER — DEXAMETHASONE 4 MG PO TABS: 8.0000 mg | ORAL_TABLET | Freq: Every day | ORAL | 0 refills | Status: DC

## 2023-06-10 ENCOUNTER — Emergency Department: Payer: Medicare Other

## 2023-06-10 ENCOUNTER — Ambulatory Visit
Admission: RE | Admit: 2023-06-10 | Discharge: 2023-06-10 | Disposition: A | Discharge status: Home / Self Care | Payer: Medicare Other | Source: Ambulatory Visit | Attending: Radiation Oncology | Admitting: Radiation Oncology

## 2023-06-10 ENCOUNTER — Other Ambulatory Visit: Payer: Self-pay

## 2023-06-10 ENCOUNTER — Emergency Department
Admission: EM | Admit: 2023-06-10 | Discharge: 2023-06-10 | Disposition: A | Discharge status: Home / Self Care | Payer: Medicare Other | Attending: Emergency Medicine | Admitting: Emergency Medicine

## 2023-06-10 DIAGNOSIS — E860 Dehydration: Secondary | ICD-10-CM | POA: Insufficient documentation

## 2023-06-10 DIAGNOSIS — R000 Tachycardia, unspecified: Secondary | ICD-10-CM | POA: Diagnosis not present

## 2023-06-10 DIAGNOSIS — R531 Weakness: Secondary | ICD-10-CM | POA: Diagnosis present

## 2023-06-10 DIAGNOSIS — D72819 Decreased white blood cell count, unspecified: Secondary | ICD-10-CM | POA: Insufficient documentation

## 2023-06-10 DIAGNOSIS — Z510 Encounter for antineoplastic radiation therapy: Secondary | ICD-10-CM | POA: Diagnosis not present

## 2023-06-10 DIAGNOSIS — I493 Ventricular premature depolarization: Secondary | ICD-10-CM | POA: Insufficient documentation

## 2023-06-10 HISTORY — DX: Malignant (primary) neoplasm, unspecified: C80.1

## 2023-06-10 HISTORY — DX: Malignant neoplasm of pharynx, unspecified: C14.0

## 2023-06-10 LAB — RAD ONC ARIA SESSION SUMMARY
Course Elapsed Days: 38
Plan Fractions Treated to Date: 29
Plan Prescribed Dose Per Fraction: 2 Gy
Plan Total Fractions Prescribed: 35
Plan Total Prescribed Dose: 70 Gy
Reference Point Dosage Given to Date: 58 Gy
Reference Point Session Dosage Given: 2 Gy
Session Number: 29

## 2023-06-10 LAB — CBC WITH DIFFERENTIAL/PLATELET
Abs Immature Granulocytes: 0.01 10*3/uL (ref 0.00–0.07)
Basophils Absolute: 0 10*3/uL (ref 0.0–0.1)
Basophils Relative: 0 %
Eosinophils Absolute: 0 10*3/uL (ref 0.0–0.5)
Eosinophils Relative: 0 %
HCT: 40.9 % (ref 39.0–52.0)
Hemoglobin: 14.4 g/dL (ref 13.0–17.0)
Immature Granulocytes: 0 %
Lymphocytes Relative: 21 %
Lymphs Abs: 0.7 10*3/uL (ref 0.7–4.0)
MCH: 31.2 pg (ref 26.0–34.0)
MCHC: 35.2 g/dL (ref 30.0–36.0)
MCV: 88.7 fL (ref 80.0–100.0)
Monocytes Absolute: 0.5 10*3/uL (ref 0.1–1.0)
Monocytes Relative: 14 %
Neutro Abs: 2.1 10*3/uL (ref 1.7–7.7)
Neutrophils Relative %: 65 %
Platelets: 61 10*3/uL — ABNORMAL LOW (ref 150–400)
RBC: 4.61 MIL/uL (ref 4.22–5.81)
RDW: 12.1 % (ref 11.5–15.5)
WBC: 3.3 10*3/uL — ABNORMAL LOW (ref 4.0–10.5)
nRBC: 0 % (ref 0.0–0.2)

## 2023-06-10 LAB — COMPREHENSIVE METABOLIC PANEL
ALT: 64 U/L — ABNORMAL HIGH (ref 0–44)
AST: 36 U/L (ref 15–41)
Albumin: 4 g/dL (ref 3.5–5.0)
Alkaline Phosphatase: 64 U/L (ref 38–126)
Anion gap: 12 (ref 5–15)
BUN: 21 mg/dL (ref 8–23)
CO2: 27 mmol/L (ref 22–32)
Calcium: 9.1 mg/dL (ref 8.9–10.3)
Chloride: 98 mmol/L (ref 98–111)
Creatinine, Ser: 1.19 mg/dL (ref 0.61–1.24)
GFR, Estimated: >60 mL/min (ref >60)
Glucose, Bld: 138 mg/dL — ABNORMAL HIGH (ref 70–99)
Potassium: 4.2 mmol/L (ref 3.5–5.1)
Sodium: 137 mmol/L (ref 135–145)
Total Bilirubin: 1.1 mg/dL (ref 0.0–1.2)
Total Protein: 7.4 g/dL (ref 6.5–8.1)

## 2023-06-10 LAB — PROTIME-INR
INR: 0.9 (ref 0.8–1.2)
Prothrombin Time: 12.5 s (ref 11.4–15.2)

## 2023-06-10 LAB — LACTIC ACID, PLASMA
Lactic Acid, Venous: 1.6 mmol/L (ref 0.5–1.9)
Lactic Acid, Venous: 1.1 mmol/L (ref 0.5–1.9)

## 2023-06-10 MED ORDER — SODIUM CHLORIDE 0.9 % IV BOLUS
1000.0000 mL | Freq: Once | INTRAVENOUS | Status: AC
  Administered 2023-06-10: 1000 mL via INTRAVENOUS

## 2023-06-10 NOTE — ED Triage Notes (Signed)
See First RN note.  Pt c/o increased weakness and fatigue x2 days and near syncopal episode after receiving radiation.  Hx of throat Ca and receives chemo and radiation.    Pt denies sick contacts.

## 2023-06-10 NOTE — Discharge Instructions (Signed)
Please seek medical attention for any high fevers, chest pain, shortness of breath, change in behavior, persistent vomiting, bloody stool or any other new or concerning symptoms.

## 2023-06-10 NOTE — ED Notes (Signed)
Patient discharged from ED by provider. Discharge instructions reviewed with patient and all questions answered. Patient ambulatory from ED in NAD.

## 2023-06-10 NOTE — ED Provider Triage Note (Signed)
Emergency Medicine Provider Triage Evaluation Note  Carl Waters , a 68 y.o. male  was evaluated in triage.  Pt complains of weakness and near syncopal episode today after chemo treatment. No pain at this time  Review of Systems  Positive:  Negative:   Physical Exam  Ht 6' (1.829 m)   Wt 76.7 kg   BMI 22.92 kg/m  Gen:   Awake, no distress   Resp:  Normal effort  MSK:   Moves extremities without difficulty  Other:  Equal grip strength bilaterally. 5/5 UE strength.   Medical Decision Making  Medically screening exam initiated at 4:44 PM.  Appropriate orders placed.  Carl Waters was informed that the remainder of the evaluation will be completed by another provider, this initial triage assessment does not replace that evaluation, and the importance of remaining in the ED until their evaluation is complete.    Romeo Apple, Savyon Loken A, PA-C 06/10/23 1646

## 2023-06-10 NOTE — ED Notes (Signed)
First Nurse Note: Pt to ED via [facility] from home for near syncopal, generalized weakness, and fatigue. Pt has hx/o throat cancer. Pt is on chemo and radiation. Pt went for chemo yesterday and could not have it because his WBC was too low. Pt did have radiation today.    HR 111 BP-114/71

## 2023-06-10 NOTE — ED Provider Notes (Signed)
[facility] Provider Note    Event Date/Time   First MD Initiated Contact with Patient 06/10/23 1721     (approximate)   History   CA Pt and Weakness   HPI  Carl Waters is a 68 y.o. male who presents to the emergency department today because of concerns for weakness.  Patient states that the symptoms started a few days ago.  He has recently been getting treatment for cancer.  He says that he has been trying to drink although has not eaten much during this time.  He feels generalized weakness.  He denies any fevers.     Physical Exam   Triage Vital Signs: ED Triage Vitals [06/10/23 1644]  Encounter Vitals Group     BP 90/63     Systolic BP Percentile      Diastolic BP Percentile      Pulse Rate (!) 109     Resp 18     Temp 98.8 F (37.1 C)     Temp Source Oral     SpO2 95 %     Weight 169 lb (76.7 kg)     Height 6' (1.829 m)     Head Circumference      Peak Flow      Pain Score 0     Pain Loc      Pain Education      Exclude from Growth Chart     Most recent vital signs: Vitals:   06/10/23 1644  BP: 90/63  Pulse: (!) 109  Resp: 18  Temp: 98.8 F (37.1 C)  SpO2: 95%   General: Awake, alert, oriented. CV:  Good peripheral perfusion. Regular rate and rhythm. Resp:  Normal effort. Lungs clear. Abd:  No distention.    ED Results / Procedures / Treatments   Labs (all labs ordered are listed, but only abnormal results are displayed) Labs Reviewed  CBC WITH DIFFERENTIAL/PLATELET - Abnormal; Notable for the following components:      Result Value   WBC 3.3 (*)    Platelets 61 (*)    All other components within normal limits  CULTURE, BLOOD (ROUTINE X 2)  CULTURE, BLOOD (ROUTINE X 2)  COMPREHENSIVE METABOLIC PANEL  LACTIC ACID, PLASMA  LACTIC ACID, PLASMA  PROTIME-INR  URINALYSIS, W/ REFLEX TO CULTURE (INFECTION SUSPECTED)     EKG  I, Phineas Semen, attending physician, personally viewed and interpreted this  EKG  EKG Time: 1658 Rate: 128 Rhythm: sinus tachycardia with PVC Axis: normal Intervals: qtc 461 QRS: narrow, q waves v1, v2 ST changes: no st elevation Impression: abnormal ekg   RADIOLOGY I independently interpreted and visualized the CXR. My interpretation: No pneumonia Radiology interpretation:  IMPRESSION:  No acute cardiopulmonary disease.  Elevated left hemidiaphragm.    Chest port     PROCEDURES:  Critical Care performed: No    MEDICATIONS ORDERED IN ED: Medications - No data to display   IMPRESSION / MDM / ASSESSMENT AND PLAN / ED COURSE  I reviewed the triage vital signs and the nursing notes.                              Differential diagnosis includes, but is not limited to, anemia, electrolyte abnormality, dehydration, infection  Patient's presentation is most consistent with acute presentation with potential threat to life or bodily function.   The patient is on the cardiac monitor to evaluate for evidence of  arrhythmia and/or significant heart rate changes.  Patient presented to the emergency department today because of concerns for generalized weakness in the setting of receiving recent chemo and radiation therapy.  The patient is generally weak.  Blood work here with mild leukopenia.  Electrolytes without concerning abnormalities.  Patient was afebrile.  Patient was found to be orthostatic here.  Will give IV fluids and reassess.  Patient did feel better after IV fluids.  Heart rate did improve.  At this time given that he feels better and reassuring blood work that he is reasonable for patient be discharged.  I did discuss the patient portance of following up with oncologist tomorrow.      FINAL CLINICAL IMPRESSION(S) / ED DIAGNOSES   Final diagnoses:  Weakness  Dehydration     Note:  This document was prepared using Dragon voice recognition software and may include unintentional dictation errors.    Phineas Semen, MD 06/10/23  2230

## 2023-06-11 ENCOUNTER — Other Ambulatory Visit: Payer: Self-pay

## 2023-06-11 ENCOUNTER — Ambulatory Visit
Admission: RE | Admit: 2023-06-11 | Discharge: 2023-06-11 | Disposition: A | Discharge status: Home / Self Care | Payer: Medicare Other | Source: Ambulatory Visit | Attending: Radiation Oncology | Admitting: Radiation Oncology

## 2023-06-11 DIAGNOSIS — Z510 Encounter for antineoplastic radiation therapy: Secondary | ICD-10-CM | POA: Diagnosis not present

## 2023-06-11 LAB — RAD ONC ARIA SESSION SUMMARY
Course Elapsed Days: 39
Plan Fractions Treated to Date: 30
Plan Prescribed Dose Per Fraction: 2 Gy
Plan Total Fractions Prescribed: 35
Plan Total Prescribed Dose: 70 Gy
Reference Point Dosage Given to Date: 60 Gy
Reference Point Session Dosage Given: 2 Gy
Session Number: 30

## 2023-06-12 ENCOUNTER — Other Ambulatory Visit: Payer: Self-pay

## 2023-06-14 ENCOUNTER — Ambulatory Visit
Admission: RE | Admit: 2023-06-14 | Discharge: 2023-06-14 | Disposition: A | Discharge status: Home / Self Care | Payer: Medicare Other | Source: Ambulatory Visit | Attending: Radiation Oncology | Admitting: Radiation Oncology

## 2023-06-14 ENCOUNTER — Other Ambulatory Visit: Payer: Self-pay

## 2023-06-14 DIAGNOSIS — Z510 Encounter for antineoplastic radiation therapy: Secondary | ICD-10-CM | POA: Diagnosis not present

## 2023-06-14 LAB — RAD ONC ARIA SESSION SUMMARY
Course Elapsed Days: 42
Plan Fractions Treated to Date: 31
Plan Prescribed Dose Per Fraction: 2 Gy
Plan Total Fractions Prescribed: 35
Plan Total Prescribed Dose: 70 Gy
Reference Point Dosage Given to Date: 62 Gy
Reference Point Session Dosage Given: 2 Gy
Session Number: 31

## 2023-06-14 MED FILL — Fosaprepitant Dimeglumine For IV Infusion 150 MG (Base Eq): INTRAVENOUS | Qty: 5 | Status: AC

## 2023-06-15 ENCOUNTER — Ambulatory Visit: Payer: Medicare Other

## 2023-06-15 ENCOUNTER — Ambulatory Visit
Admission: RE | Admit: 2023-06-15 | Discharge: 2023-06-15 | Disposition: A | Discharge status: Home / Self Care | Payer: Medicare Other | Source: Ambulatory Visit | Attending: Radiation Oncology | Admitting: Radiation Oncology

## 2023-06-15 ENCOUNTER — Inpatient Hospital Stay (HOSPITAL_BASED_OUTPATIENT_CLINIC_OR_DEPARTMENT_OTHER): Payer: Medicare Other | Admitting: Oncology

## 2023-06-15 ENCOUNTER — Inpatient Hospital Stay: Payer: Medicare Other

## 2023-06-15 ENCOUNTER — Other Ambulatory Visit: Payer: Medicare Other

## 2023-06-15 ENCOUNTER — Other Ambulatory Visit: Payer: Self-pay

## 2023-06-15 ENCOUNTER — Encounter: Payer: Self-pay | Admitting: Oncology

## 2023-06-15 VITALS — BP 91/80 | HR 62 | Temp 95.9°F | Resp 18 | Wt 164.7 lb

## 2023-06-15 DIAGNOSIS — C109 Malignant neoplasm of oropharynx, unspecified: Secondary | ICD-10-CM

## 2023-06-15 DIAGNOSIS — Z5111 Encounter for antineoplastic chemotherapy: Secondary | ICD-10-CM

## 2023-06-15 DIAGNOSIS — D701 Agranulocytosis secondary to cancer chemotherapy: Secondary | ICD-10-CM | POA: Diagnosis not present

## 2023-06-15 DIAGNOSIS — T451X5A Adverse effect of antineoplastic and immunosuppressive drugs, initial encounter: Secondary | ICD-10-CM

## 2023-06-15 DIAGNOSIS — D6959 Other secondary thrombocytopenia: Secondary | ICD-10-CM | POA: Diagnosis not present

## 2023-06-15 DIAGNOSIS — Z510 Encounter for antineoplastic radiation therapy: Secondary | ICD-10-CM | POA: Diagnosis not present

## 2023-06-15 LAB — RAD ONC ARIA SESSION SUMMARY
Course Elapsed Days: 43
Plan Fractions Treated to Date: 32
Plan Prescribed Dose Per Fraction: 2 Gy
Plan Total Fractions Prescribed: 35
Plan Total Prescribed Dose: 70 Gy
Reference Point Dosage Given to Date: 64 Gy
Reference Point Session Dosage Given: 2 Gy
Session Number: 32

## 2023-06-15 LAB — CULTURE, BLOOD (ROUTINE X 2)
Culture: NO GROWTH
Culture: NO GROWTH
Special Requests: ADEQUATE

## 2023-06-15 LAB — CBC WITH DIFFERENTIAL (CANCER CENTER ONLY)
Abs Immature Granulocytes: 0.01 10*3/uL (ref 0.00–0.07)
Basophils Absolute: 0 10*3/uL (ref 0.0–0.1)
Basophils Relative: 0 %
Eosinophils Absolute: 0 10*3/uL (ref 0.0–0.5)
Eosinophils Relative: 0 %
HCT: 33.9 % — ABNORMAL LOW (ref 39.0–52.0)
Hemoglobin: 11.9 g/dL — ABNORMAL LOW (ref 13.0–17.0)
Immature Granulocytes: 0 %
Lymphocytes Relative: 28 %
Lymphs Abs: 0.8 10*3/uL (ref 0.7–4.0)
MCH: 31.2 pg (ref 26.0–34.0)
MCHC: 35.1 g/dL (ref 30.0–36.0)
MCV: 88.7 fL (ref 80.0–100.0)
Monocytes Absolute: 0.6 10*3/uL (ref 0.1–1.0)
Monocytes Relative: 22 %
Neutro Abs: 1.4 10*3/uL — ABNORMAL LOW (ref 1.7–7.7)
Neutrophils Relative %: 50 %
Platelet Count: 80 10*3/uL — ABNORMAL LOW (ref 150–400)
RBC: 3.82 MIL/uL — ABNORMAL LOW (ref 4.22–5.81)
RDW: 12.3 % (ref 11.5–15.5)
WBC Count: 2.9 10*3/uL — ABNORMAL LOW (ref 4.0–10.5)
nRBC: 0 % (ref 0.0–0.2)

## 2023-06-15 LAB — BASIC METABOLIC PANEL - CANCER CENTER ONLY
Anion gap: 11 (ref 5–15)
BUN: 25 mg/dL — ABNORMAL HIGH (ref 8–23)
CO2: 24 mmol/L (ref 22–32)
Calcium: 8.5 mg/dL — ABNORMAL LOW (ref 8.9–10.3)
Chloride: 97 mmol/L — ABNORMAL LOW (ref 98–111)
Creatinine: 0.93 mg/dL (ref 0.61–1.24)
GFR, Estimated: >60 mL/min (ref >60)
Glucose, Bld: 115 mg/dL — ABNORMAL HIGH (ref 70–99)
Potassium: 3.7 mmol/L (ref 3.5–5.1)
Sodium: 132 mmol/L — ABNORMAL LOW (ref 135–145)

## 2023-06-15 LAB — MAGNESIUM: Magnesium: 1.8 mg/dL (ref 1.7–2.4)

## 2023-06-15 MED ORDER — MAGNESIUM SULFATE 2 GM/50ML IV SOLN
2.0000 g | Freq: Once | INTRAVENOUS | Status: AC
  Administered 2023-06-15: 2 g via INTRAVENOUS

## 2023-06-15 MED ORDER — POTASSIUM CHLORIDE IN NACL 20-0.9 MEQ/L-% IV SOLN: Freq: Once | INTRAVENOUS | Status: AC

## 2023-06-15 MED ORDER — HEPARIN SOD (PORK) LOCK FLUSH 100 UNIT/ML IV SOLN
500.0000 [IU] | Freq: Once | INTRAVENOUS | Status: AC | PRN
  Administered 2023-06-15: 500 [IU]
  Filled 2023-06-15: qty 5

## 2023-06-15 MED ORDER — PALONOSETRON HCL INJECTION 0.25 MG/5ML
0.2500 mg | Freq: Once | INTRAVENOUS | Status: AC
  Administered 2023-06-15: 0.25 mg via INTRAVENOUS
  Filled 2023-06-15: qty 5

## 2023-06-15 MED ORDER — SODIUM CHLORIDE 0.9 % IV SOLN
40.0000 mg/m2 | Freq: Once | INTRAVENOUS | Status: AC
  Administered 2023-06-15: 85 mg via INTRAVENOUS
  Filled 2023-06-15: qty 85

## 2023-06-15 MED ORDER — SODIUM CHLORIDE 0.9 % IV SOLN
INTRAVENOUS | Status: DC
  Filled 2023-06-15: qty 250

## 2023-06-15 MED ORDER — DEXAMETHASONE SODIUM PHOSPHATE 10 MG/ML IJ SOLN
10.0000 mg | Freq: Once | INTRAMUSCULAR | Status: AC
  Administered 2023-06-15: 10 mg via INTRAVENOUS
  Filled 2023-06-15: qty 1

## 2023-06-15 MED ORDER — SODIUM CHLORIDE 0.9 % IV SOLN
150.0000 mg | Freq: Once | INTRAVENOUS | Status: AC
  Administered 2023-06-15: 150 mg via INTRAVENOUS
  Filled 2023-06-15: qty 5
  Filled 2023-06-15: qty 150

## 2023-06-15 NOTE — Patient Instructions (Signed)
 [facility] - A DEPT OF [facility]  Discharge Instructions: Thank you for choosing [facility] to provide your oncology and hematology care.  If you have a lab appointment with the Cancer Center, please go directly to the Cancer Center and check in at the registration area.  Wear comfortable clothing and clothing appropriate for easy access to any Portacath or PICC line.   We strive to give you quality time with your provider. You may need to reschedule your appointment if you arrive late (15 or more minutes).  Arriving late affects you and other patients whose appointments are after yours.  Also, if you miss three or more appointments without notifying the office, you may be dismissed from the clinic at the provider's discretion.      For prescription refill requests, have your pharmacy contact our office and allow 72 hours for refills to be completed.    Today you received the following chemotherapy and/or immunotherapy agents CISPLATIN      To help prevent nausea and vomiting after your treatment, we encourage you to take your nausea medication as directed.  BELOW ARE SYMPTOMS THAT SHOULD BE REPORTED IMMEDIATELY: *FEVER GREATER THAN 100.4 F (38 C) OR HIGHER *CHILLS OR SWEATING *NAUSEA AND VOMITING THAT IS NOT CONTROLLED WITH YOUR NAUSEA MEDICATION *UNUSUAL SHORTNESS OF BREATH *UNUSUAL BRUISING OR BLEEDING *URINARY PROBLEMS (pain or burning when urinating, or frequent urination) *BOWEL PROBLEMS (unusual diarrhea, constipation, pain near the anus) TENDERNESS IN MOUTH AND THROAT WITH OR WITHOUT PRESENCE OF ULCERS (sore throat, sores in mouth, or a toothache) UNUSUAL RASH, SWELLING OR PAIN  UNUSUAL VAGINAL DISCHARGE OR ITCHING   Items with * indicate a potential emergency and should be followed up as soon as possible or go to the Emergency Department if any problems should occur.  Please show the CHEMOTHERAPY ALERT CARD or IMMUNOTHERAPY  ALERT CARD at check-in to the Emergency Department and triage nurse.  Should you have questions after your visit or need to cancel or reschedule your appointment, please contact [facility] - A DEPT OF [facility]  [phone] and follow the prompts.  Office hours are 8:00 a.m. to 4:30 p.m. Monday - Friday. Please note that voicemails left after 4:00 p.m. may not be returned until the following business day.  We are closed weekends and major holidays. You have access to a nurse at all times for urgent questions. Please call the main number to the clinic [phone] and follow the prompts.  For any non-urgent questions, you may also contact your provider using MyChart. We now offer e-Visits for anyone 49 and older to request care online for non-urgent symptoms. For details visit [email or web address].   Also download the MyChart app! Go to the app store, search "MyChart", open the app, select [facility], and log in with your MyChart username and password.  Cisplatin Injection What is this medication? CISPLATIN (SIS pla tin) treats some types of cancer. It works by slowing down the growth of cancer cells. This medicine may be used for other purposes; ask your health care provider or pharmacist if you have questions. COMMON BRAND NAME(S): Platinol, Platinol -AQ What should I tell my care team before I take this medication? They need to know if you have any of these conditions: Eye disease, vision problems Hearing problems Kidney disease Low blood counts, such as low white cells, platelets, or red blood cells Tingling of the fingers  or toes, or other nerve disorder An unusual or allergic reaction to cisplatin, carboplatin, oxaliplatin, other medications, foods, dyes, or preservatives If you or your partner are pregnant or trying to get pregnant Breast-feeding How should I use this medication? This medication is injected into a vein. It is given by your care  team in a hospital or clinic setting. Talk to your care team about the use of this medication in children. Special care may be needed. Overdosage: If you think you have taken too much of this medicine contact a poison control center or emergency room at once. NOTE: This medicine is only for you. Do not share this medicine with others. What if I miss a dose? Keep appointments for follow-up doses. It is important not to miss your dose. Call your care team if you are unable to keep an appointment. What may interact with this medication? Do not take this medication with any of the following: Live virus vaccines This medication may also interact with the following: Certain antibiotics, such as amikacin, gentamicin, neomycin, polymyxin B, streptomycin, tobramycin, vancomycin Foscarnet This list may not describe all possible interactions. Give your health care provider a list of all the medicines, herbs, non-prescription drugs, or dietary supplements you use. Also tell them if you smoke, drink alcohol, or use illegal drugs. Some items may interact with your medicine. What should I watch for while using this medication? Your condition will be monitored carefully while you are receiving this medication. You may need blood work done while taking this medication. This medication may make you feel generally unwell. This is not uncommon, as chemotherapy can affect healthy cells as well as cancer cells. Report any side effects. Continue your course of treatment even though you feel ill unless your care team tells you to stop. This medication may increase your risk of getting an infection. Call your care team for advice if you get a fever, chills, sore throat, or other symptoms of a cold or flu. Do not treat yourself. Try to avoid being around people who are sick. Avoid taking medications that contain aspirin, acetaminophen, ibuprofen, naproxen, or ketoprofen unless instructed by your care team. These medications  may hide a fever. This medication may increase your risk to bruise or bleed. Call your care team if you notice any unusual bleeding. Be careful brushing or flossing your teeth or using a toothpick because you may get an infection or bleed more easily. If you have any dental work done, tell your dentist you are receiving this medication. Drink fluids as directed while you are taking this medication. This will help protect your kidneys. Call your care team if you get diarrhea. Do not treat yourself. Talk to your care team if you or your partner wish to become pregnant or think you might be pregnant. This medication can cause serious birth defects if taken during pregnancy and for 14 months after the last dose. A negative pregnancy test is required before starting this medication. A reliable form of contraception is recommended while taking this medication and for 14 months after the last dose. Talk to your care team about effective forms of contraception. Do not father a child while taking this medication and for 11 months after the last dose. Use a condom during sex during this time period. Do not breast-feed while taking this medication. This medication may cause infertility. Talk to your care team if you are concerned about your fertility. What side effects may I notice from receiving this medication? Side  effects that you should report to your care team as soon as possible: Allergic reactions--skin rash, itching, hives, swelling of the face, lips, tongue, or throat Eye pain, change in vision, vision loss Hearing loss, ringing in ears Infection--fever, chills, cough, sore throat, wounds that don't heal, pain or trouble when passing urine, general feeling of discomfort or being unwell Kidney injury--decrease in the amount of urine, swelling of the ankles, hands, or feet Low red blood cell level--unusual weakness or fatigue, dizziness, headache, trouble breathing Painful swelling, warmth, or redness  of the skin, blisters or sores at the infusion site Pain, tingling, or numbness in the hands or feet Unusual bruising or bleeding Side effects that usually do not require medical attention (report to your care team if they continue or are bothersome): Hair loss Nausea Vomiting This list may not describe all possible side effects. Call your doctor for medical advice about side effects. You may report side effects to FDA at 1-800-FDA-1088. Where should I keep my medication? This medication is given in a hospital or clinic. It will not be stored at home. NOTE: This sheet is a summary. It may not cover all possible information. If you have questions about this medicine, talk to your doctor, pharmacist, or health care provider.  2024 Elsevier/Gold Standard (2021-08-15 00:00:00)
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
## 2023-06-16 ENCOUNTER — Other Ambulatory Visit: Payer: Self-pay

## 2023-06-16 ENCOUNTER — Ambulatory Visit: Payer: Medicare Other

## 2023-06-16 ENCOUNTER — Ambulatory Visit
Admission: RE | Admit: 2023-06-16 | Discharge: 2023-06-16 | Disposition: A | Discharge status: Home / Self Care | Payer: Medicare Other | Source: Ambulatory Visit | Attending: Radiation Oncology | Admitting: Radiation Oncology

## 2023-06-16 DIAGNOSIS — Z510 Encounter for antineoplastic radiation therapy: Secondary | ICD-10-CM | POA: Diagnosis not present

## 2023-06-16 LAB — RAD ONC ARIA SESSION SUMMARY
Course Elapsed Days: 44
Plan Fractions Treated to Date: 33
Plan Prescribed Dose Per Fraction: 2 Gy
Plan Total Fractions Prescribed: 35
Plan Total Prescribed Dose: 70 Gy
Reference Point Dosage Given to Date: 66 Gy
Reference Point Session Dosage Given: 2 Gy
Session Number: 33

## 2023-06-17 ENCOUNTER — Ambulatory Visit: Payer: Medicare Other

## 2023-06-17 ENCOUNTER — Ambulatory Visit
Admission: RE | Admit: 2023-06-17 | Discharge: 2023-06-17 | Disposition: A | Discharge status: Home / Self Care | Payer: Medicare Other | Source: Ambulatory Visit | Attending: Radiation Oncology | Admitting: Radiation Oncology

## 2023-06-17 ENCOUNTER — Other Ambulatory Visit: Payer: Self-pay

## 2023-06-17 DIAGNOSIS — Z510 Encounter for antineoplastic radiation therapy: Secondary | ICD-10-CM | POA: Diagnosis not present

## 2023-06-17 LAB — RAD ONC ARIA SESSION SUMMARY
Course Elapsed Days: 45
Plan Fractions Treated to Date: 34
Plan Prescribed Dose Per Fraction: 2 Gy
Plan Total Fractions Prescribed: 35
Plan Total Prescribed Dose: 70 Gy
Reference Point Dosage Given to Date: 68 Gy
Reference Point Session Dosage Given: 2 Gy
Session Number: 34

## 2023-06-18 ENCOUNTER — Ambulatory Visit
Admission: RE | Admit: 2023-06-18 | Discharge: 2023-06-18 | Disposition: A | Discharge status: Home / Self Care | Payer: Medicare Other | Source: Ambulatory Visit | Attending: Radiation Oncology | Admitting: Radiation Oncology

## 2023-06-18 ENCOUNTER — Other Ambulatory Visit: Payer: Self-pay

## 2023-06-18 DIAGNOSIS — Z510 Encounter for antineoplastic radiation therapy: Secondary | ICD-10-CM | POA: Diagnosis not present

## 2023-06-18 LAB — RAD ONC ARIA SESSION SUMMARY
Course Elapsed Days: 46
Plan Fractions Treated to Date: 35
Plan Prescribed Dose Per Fraction: 2 Gy
Plan Total Fractions Prescribed: 35
Plan Total Prescribed Dose: 70 Gy
Reference Point Dosage Given to Date: 70 Gy
Reference Point Session Dosage Given: 2 Gy
Session Number: 35

## 2023-06-21 ENCOUNTER — Emergency Department: Payer: Medicare Other

## 2023-06-21 ENCOUNTER — Other Ambulatory Visit: Payer: Self-pay

## 2023-06-21 ENCOUNTER — Inpatient Hospital Stay
Admission: EM | Admit: 2023-06-21 | Discharge: 2023-06-24 | DRG: 157 | Disposition: A | Discharge status: Home Health | Payer: Medicare Other | Attending: Internal Medicine | Admitting: Internal Medicine

## 2023-06-21 DIAGNOSIS — K120 Recurrent oral aphthae: Secondary | ICD-10-CM | POA: Diagnosis present

## 2023-06-21 DIAGNOSIS — Z923 Personal history of irradiation: Secondary | ICD-10-CM

## 2023-06-21 DIAGNOSIS — C109 Malignant neoplasm of oropharynx, unspecified: Secondary | ICD-10-CM | POA: Diagnosis not present

## 2023-06-21 DIAGNOSIS — J4489 Other specified chronic obstructive pulmonary disease: Secondary | ICD-10-CM | POA: Diagnosis present

## 2023-06-21 DIAGNOSIS — Z955 Presence of coronary angioplasty implant and graft: Secondary | ICD-10-CM

## 2023-06-21 DIAGNOSIS — J189 Pneumonia, unspecified organism: Secondary | ICD-10-CM | POA: Diagnosis not present

## 2023-06-21 DIAGNOSIS — Z79899 Other long term (current) drug therapy: Secondary | ICD-10-CM

## 2023-06-21 DIAGNOSIS — K1230 Oral mucositis (ulcerative), unspecified: Secondary | ICD-10-CM | POA: Diagnosis not present

## 2023-06-21 DIAGNOSIS — Z7982 Long term (current) use of aspirin: Secondary | ICD-10-CM

## 2023-06-21 DIAGNOSIS — S01551A Open bite of lip, initial encounter: Secondary | ICD-10-CM | POA: Diagnosis present

## 2023-06-21 DIAGNOSIS — E861 Hypovolemia: Secondary | ICD-10-CM

## 2023-06-21 DIAGNOSIS — Z7984 Long term (current) use of oral hypoglycemic drugs: Secondary | ICD-10-CM

## 2023-06-21 DIAGNOSIS — Z515 Encounter for palliative care: Secondary | ICD-10-CM

## 2023-06-21 DIAGNOSIS — D61818 Other pancytopenia: Secondary | ICD-10-CM

## 2023-06-21 DIAGNOSIS — I959 Hypotension, unspecified: Secondary | ICD-10-CM | POA: Diagnosis not present

## 2023-06-21 DIAGNOSIS — Z801 Family history of malignant neoplasm of trachea, bronchus and lung: Secondary | ICD-10-CM

## 2023-06-21 DIAGNOSIS — I2510 Atherosclerotic heart disease of native coronary artery without angina pectoris: Secondary | ICD-10-CM | POA: Diagnosis present

## 2023-06-21 DIAGNOSIS — I219 Acute myocardial infarction, unspecified: Secondary | ICD-10-CM | POA: Insufficient documentation

## 2023-06-21 DIAGNOSIS — E119 Type 2 diabetes mellitus without complications: Secondary | ICD-10-CM

## 2023-06-21 DIAGNOSIS — Z888 Allergy status to other drugs, medicaments and biological substances status: Secondary | ICD-10-CM

## 2023-06-21 DIAGNOSIS — I10 Essential (primary) hypertension: Secondary | ICD-10-CM | POA: Diagnosis present

## 2023-06-21 DIAGNOSIS — T451X5A Adverse effect of antineoplastic and immunosuppressive drugs, initial encounter: Secondary | ICD-10-CM | POA: Diagnosis present

## 2023-06-21 DIAGNOSIS — K1231 Oral mucositis (ulcerative) due to antineoplastic therapy: Secondary | ICD-10-CM | POA: Diagnosis not present

## 2023-06-21 DIAGNOSIS — Z1152 Encounter for screening for COVID-19: Secondary | ICD-10-CM

## 2023-06-21 DIAGNOSIS — K219 Gastro-esophageal reflux disease without esophagitis: Secondary | ICD-10-CM | POA: Diagnosis present

## 2023-06-21 DIAGNOSIS — D61810 Antineoplastic chemotherapy induced pancytopenia: Secondary | ICD-10-CM | POA: Diagnosis present

## 2023-06-21 DIAGNOSIS — Z87891 Personal history of nicotine dependence: Secondary | ICD-10-CM

## 2023-06-21 DIAGNOSIS — R1110 Vomiting, unspecified: Secondary | ICD-10-CM | POA: Diagnosis not present

## 2023-06-21 LAB — CBC
HCT: 35.4 % — ABNORMAL LOW (ref 39.0–52.0)
Hemoglobin: 12.2 g/dL — ABNORMAL LOW (ref 13.0–17.0)
MCH: 31.3 pg (ref 26.0–34.0)
MCHC: 34.5 g/dL (ref 30.0–36.0)
MCV: 90.8 fL (ref 80.0–100.0)
Platelets: 144 10*3/uL — ABNORMAL LOW (ref 150–400)
RBC: 3.9 MIL/uL — ABNORMAL LOW (ref 4.22–5.81)
RDW: 13.2 % (ref 11.5–15.5)
WBC: 2.8 10*3/uL — ABNORMAL LOW (ref 4.0–10.5)
nRBC: 0 % (ref 0.0–0.2)

## 2023-06-21 LAB — BASIC METABOLIC PANEL
Anion gap: 11 (ref 5–15)
BUN: 33 mg/dL — ABNORMAL HIGH (ref 8–23)
CO2: 27 mmol/L (ref 22–32)
Calcium: 8.6 mg/dL — ABNORMAL LOW (ref 8.9–10.3)
Chloride: 101 mmol/L (ref 98–111)
Creatinine, Ser: 1.07 mg/dL (ref 0.61–1.24)
GFR, Estimated: >60 mL/min (ref >60)
Glucose, Bld: 116 mg/dL — ABNORMAL HIGH (ref 70–99)
Potassium: 4.7 mmol/L (ref 3.5–5.1)
Sodium: 139 mmol/L (ref 135–145)

## 2023-06-21 LAB — URINALYSIS, W/ REFLEX TO CULTURE (INFECTION SUSPECTED)
Bacteria, UA: NONE SEEN
Bilirubin Urine: NEGATIVE
Glucose, UA: >=500 mg/dL — AB
Hgb urine dipstick: NEGATIVE
Ketones, ur: 5 mg/dL — AB
Leukocytes,Ua: NEGATIVE
Nitrite: NEGATIVE
Protein, ur: NEGATIVE mg/dL
Specific Gravity, Urine: 1.025 (ref 1.005–1.030)
pH: 5 (ref 5.0–8.0)

## 2023-06-21 LAB — TROPONIN I (HIGH SENSITIVITY)
Troponin I (High Sensitivity): 14 ng/L (ref <18)
Troponin I (High Sensitivity): 13 ng/L (ref <18)

## 2023-06-21 LAB — RESP PANEL BY RT-PCR (RSV, FLU A&B, COVID)  RVPGX2
Influenza A by PCR: NEGATIVE
Influenza B by PCR: NEGATIVE
Resp Syncytial Virus by PCR: NEGATIVE
SARS Coronavirus 2 by RT PCR: NEGATIVE

## 2023-06-21 LAB — LACTIC ACID, PLASMA
Lactic Acid, Venous: 1.5 mmol/L (ref 0.5–1.9)
Lactic Acid, Venous: 1 mmol/L (ref 0.5–1.9)

## 2023-06-21 LAB — CBG MONITORING, ED: Glucose-Capillary: 83 mg/dL (ref 70–99)

## 2023-06-21 MED ORDER — ASPIRIN 81 MG PO CHEW
81.0000 mg | CHEWABLE_TABLET | Freq: Every day | ORAL | Status: DC
  Administered 2023-06-22 – 2023-06-24 (×3): 81 mg via ORAL
  Filled 2023-06-21 (×3): qty 1

## 2023-06-21 MED ORDER — SODIUM CHLORIDE 0.9 % IV SOLN
2.0000 g | INTRAVENOUS | Status: DC
  Administered 2023-06-22 – 2023-06-24 (×3): 2 g via INTRAVENOUS
  Filled 2023-06-21 (×3): qty 20

## 2023-06-21 MED ORDER — SODIUM CHLORIDE 0.9% FLUSH
10.0000 mL | Freq: Two times a day (BID) | INTRAVENOUS | Status: DC
  Administered 2023-06-21: 10 mL
  Administered 2023-06-23: 20 mL
  Administered 2023-06-23 – 2023-06-24 (×2): 10 mL

## 2023-06-21 MED ORDER — LACTATED RINGERS IV BOLUS (SEPSIS)
1000.0000 mL | Freq: Once | INTRAVENOUS | Status: AC
  Administered 2023-06-21: 1000 mL via INTRAVENOUS

## 2023-06-21 MED ORDER — SODIUM CHLORIDE 0.9 % IV SOLN
500.0000 mg | INTRAVENOUS | Status: DC
Start: 2023-06-21 — End: 2023-06-24
  Administered 2023-06-21 – 2023-06-23 (×3): 500 mg via INTRAVENOUS
  Filled 2023-06-21 (×3): qty 5

## 2023-06-21 MED ORDER — LACTATED RINGERS IV BOLUS (SEPSIS)
250.0000 mL | Freq: Once | INTRAVENOUS | Status: AC
  Administered 2023-06-21: 250 mL via INTRAVENOUS

## 2023-06-21 MED ORDER — LIDOCAINE VISCOUS HCL 2 % MT SOLN
15.0000 mL | OROMUCOSAL | Status: DC | PRN
  Filled 2023-06-21: qty 15

## 2023-06-21 MED ORDER — ENOXAPARIN SODIUM 40 MG/0.4ML IJ SOSY
40.0000 mg | PREFILLED_SYRINGE | INTRAMUSCULAR | Status: DC
  Administered 2023-06-21 – 2023-06-23 (×3): 40 mg via SUBCUTANEOUS
  Filled 2023-06-21 (×3): qty 0.4

## 2023-06-21 MED ORDER — SODIUM CHLORIDE 0.9 % IV SOLN
2.0000 g | Freq: Once | INTRAVENOUS | Status: AC
  Administered 2023-06-21: 2 g via INTRAVENOUS
  Filled 2023-06-21: qty 12.5

## 2023-06-21 MED ORDER — MORPHINE SULFATE 10 MG/5ML PO SOLN: 5.0000 mg | ORAL | Status: DC | PRN

## 2023-06-21 MED ORDER — SODIUM CHLORIDE 0.9% FLUSH
3.0000 mL | Freq: Two times a day (BID) | INTRAVENOUS | Status: DC
  Administered 2023-06-22 – 2023-06-24 (×5): 3 mL via INTRAVENOUS

## 2023-06-21 MED ORDER — CHLORHEXIDINE GLUCONATE CLOTH 2 % EX PADS
6.0000 | MEDICATED_PAD | Freq: Every day | CUTANEOUS | Status: DC
  Administered 2023-06-21 – 2023-06-24 (×4): 6 via TOPICAL

## 2023-06-21 MED ORDER — ACETAMINOPHEN 160 MG/5ML PO SOLN: 650.0000 mg | Freq: Four times a day (QID) | ORAL | Status: DC | PRN

## 2023-06-21 MED ORDER — VANCOMYCIN HCL IN DEXTROSE 1-5 GM/200ML-% IV SOLN
1000.0000 mg | Freq: Once | INTRAVENOUS | Status: AC
  Administered 2023-06-21: 1000 mg via INTRAVENOUS
  Filled 2023-06-21: qty 200

## 2023-06-21 MED ORDER — SODIUM CHLORIDE 0.9% FLUSH: 10.0000 mL | INTRAVENOUS | Status: DC | PRN

## 2023-06-21 NOTE — Assessment & Plan Note (Signed)
 Holding home antihypertensive medications.

## 2023-06-21 NOTE — Radiation Completion Notes (Signed)
 Patient Name: Carl Waters, Carl Waters MRN: [id number] Date of Birth: 1956-04-17 Referring Physician: Velora Mediate, M.D. Date of Service: 2023-06-21 Radiation Oncologist: Carmina Miller, M.D. [facility]                             RADIATION ONCOLOGY END OF TREATMENT NOTE     Diagnosis: C10.9 Malignant neoplasm of oropharynx, unspecified Staging on 2023-03-31: Squamous cell carcinoma of oropharynx (HCC) T=cT2, N=cN1, M=cM0 Intent: Curative     HPI: Patient is a 68 year old male who presented with a left-sided toothache for several months.  He eventually was noted to have a mass in his left submandibular region fixed.  Imaging studies showed a left tonsillar mass and possible adenopathy in the neck.  PET CT scan was performed showing a 2.5 x 2.9 cm hypermetabolic left tonsillar mass as well as hypermetabolic left level 2 lymph nodes measuring up to 1.4 cm.  He was also noted to have a right shoulder joint effusion associated with hypermetabolic activity worrisome for infection.  He recently had the tooth pulled on the left yesterday.  He specifically denies any dysphagia.  Is having significant pain from the tooth extraction.  Tumor was p16 positive.  Patient has seen medical oncology with recommendation for concurrent treatment.      ==========DELIVERED PLANS==========  First Treatment Date: 2023-05-03 Last Treatment Date: 2023-06-18   Plan Name: HN_L Site: Tonsil, Left Technique: IMRT Mode: Photon Dose Per Fraction: 2 Gy Prescribed Dose (Delivered / Prescribed): 70 Gy / 70 Gy Prescribed Fxs (Delivered / Prescribed): 35 / 35     ==========ON TREATMENT VISIT DATES========== 2023-05-04, 2023-05-12, 2023-05-18, 2023-05-25, 2023-06-01, 2023-06-08, 2023-06-15     ==========UPCOMING VISITS==========       ==========APPENDIX - ON TREATMENT VISIT NOTES==========   See weekly On Treatment Notes in Epic for details in the Media tab (listed as Progress notes on the On  Treatment Visit Dates listed above).

## 2023-06-21 NOTE — ED Provider Notes (Signed)
 [facility] Provider Note    Event Date/Time   First MD Initiated Contact with Patient 06/21/23 1515     (approximate)   History   Weakness   HPI  Carl Waters is a 68 y.o. male with a history of CAD, hypertension, and oropharyngeal cancer on chemotherapy who presents with gradual onset worsening generalized weakness over the last several days, associated with feeling lightheaded and feeling like he may fall over or pass out when he is standing up.  He reports decreased appetite as well as difficulty swallowing, so has taken very little by mouth over the last few days.  He denies any vomiting or diarrhea.  He has no chest, abdominal, or other acute pain.  I reviewed the past medical records.  The patient was seen by Dr. Smith Robert from oncology for follow-up on 2/18.  He last received chemotherapy last week.   Physical Exam   Triage Vital Signs: ED Triage Vitals  Encounter Vitals Group     BP 06/21/23 1416 92/62     Systolic BP Percentile --      Diastolic BP Percentile --      Pulse Rate 06/21/23 1416 (!) 52     Resp 06/21/23 1416 17     Temp 06/21/23 1416 98.6 F (37 C)     Temp Source 06/21/23 1416 Oral     SpO2 06/21/23 1416 95 %     Weight 06/21/23 1410 164 lb 11.2 oz (74.7 kg)     Height 06/21/23 1410 6' (1.829 m)     Head Circumference --      Peak Flow --      Pain Score 06/21/23 1410 0     Pain Loc --      Pain Education --      Exclude from Growth Chart --     Most recent vital signs: Vitals:   06/21/23 1416 06/21/23 1648  BP: 92/62 (!) 134/56  Pulse: (!) 52 74  Resp: 17 16  Temp: 98.6 F (37 C)   SpO2: 95% 100%     General: Alert and oriented, weak appearing but in no distress.  CV:  Good peripheral perfusion.  Resp:  Normal effort.  Lungs CTAB. Abd:  Soft and nontender.  No distention.  Other:  Dry mucous membranes.  Normal speech.  Motor intact in all extremities.   ED Results / Procedures / Treatments   Labs (all  labs ordered are listed, but only abnormal results are displayed) Labs Reviewed  BASIC METABOLIC PANEL - Abnormal; Notable for the following components:      Result Value   Glucose, Bld 116 (*)    BUN 33 (*)    Calcium 8.6 (*)    All other components within normal limits  CBC - Abnormal; Notable for the following components:   WBC 2.8 (*)    RBC 3.90 (*)    Hemoglobin 12.2 (*)    HCT 35.4 (*)    Platelets 144 (*)    All other components within normal limits  URINALYSIS, W/ REFLEX TO CULTURE (INFECTION SUSPECTED) - Abnormal; Notable for the following components:   Color, Urine YELLOW (*)    APPearance CLEAR (*)    Glucose, UA >=500 (*)    Ketones, ur 5 (*)    All other components within normal limits  RESP PANEL BY RT-PCR (RSV, FLU A&B, COVID)  RVPGX2  CULTURE, BLOOD (ROUTINE X 2)  CULTURE, BLOOD (ROUTINE X 2)  LACTIC  ACID, PLASMA  URINALYSIS, ROUTINE W REFLEX MICROSCOPIC  LACTIC ACID, PLASMA  CBG MONITORING, ED  TROPONIN I (HIGH SENSITIVITY)  TROPONIN I (HIGH SENSITIVITY)     EKG  ED ECG REPORT I, Dionne Bucy, the attending physician, personally viewed and interpreted this ECG.  Date: 06/21/2023 EKG Time: 1414 Rate: 109 Rhythm: Sinus tachycardia with PVCs QRS Axis: normal Intervals: normal ST/T Wave abnormalities: Nonspecific ST abnormalities Narrative Interpretation: no evidence of acute ischemia    RADIOLOGY  Chest x-ray: I independently viewed and interpreted the images; there is possible left lower lobe infiltrate versus atelectasis   PROCEDURES:  Critical Care performed: Yes, see critical care procedure note(s)  .Critical Care  Performed by: Dionne Bucy, MD Authorized by: Dionne Bucy, MD   Critical care provider statement:    Critical care time (minutes):  30   Critical care time was exclusive of:  Separately billable procedures and treating other patients   Critical care was necessary to treat or prevent imminent or  life-threatening deterioration of the following conditions:  Sepsis   Critical care was time spent personally by me on the following activities:  Development of treatment plan with patient or surrogate, discussions with consultants, evaluation of patient's response to treatment, examination of patient, ordering and review of laboratory studies, ordering and review of radiographic studies, ordering and performing treatments and interventions, pulse oximetry, re-evaluation of patient's condition, review of old charts and obtaining history from patient or surrogate   Care discussed with: admitting provider      MEDICATIONS ORDERED IN ED: Medications  lactated ringers bolus 1,000 mL (0 mLs Intravenous Stopped 06/21/23 1756)    And  lactated ringers bolus 1,000 mL (0 mLs Intravenous Stopped 06/21/23 1756)    And  lactated ringers bolus 250 mL (250 mLs Intravenous New Bag/Given 06/21/23 1756)  vancomycin (VANCOCIN) IVPB 1000 mg/200 mL premix (has no administration in time range)  ceFEPIme (MAXIPIME) 2 g in sodium chloride 0.9 % 100 mL IVPB (2 g Intravenous New Bag/Given 06/21/23 1755)     IMPRESSION / MDM / ASSESSMENT AND PLAN / ED COURSE  I reviewed the triage vital signs and the nursing notes.  68 year old male with PMH as noted above presents with worsening generalized weakness and lightheadedness over the last several days.  On exam the patient is hypotensive.  Other vital signs are normal.  Mucous membranes are dry.  Neurologic exam is nonfocal.  Differential diagnosis includes, but is not limited to, acute infection/sepsis, dehydration/hypovolemia, chemotherapy side effects, electrolyte abnormality, other metabolic disturbance, less likely cardiac etiology.  We will obtain labs including sepsis workup, give fluids per the sepsis protocol, obtain chest x-ray and UA, and reassess.  Patient's presentation is most consistent with acute presentation with potential threat to life or bodily  function.  The patient is on the cardiac monitor to evaluate for evidence of arrhythmia and/or significant heart rate changes.  ----------------------------------------- 6:01 PM on 06/21/2023 -----------------------------------------  Chest x-ray shows possible left lower lobe infiltrate versus atelectasis.  The patient has had some cough.  I have ordered antibiotics to cover for possible pneumonia.  Urinalysis is still pending.  Other lab workup is reassuring.  Lactate is not elevated.  WBC count is 2.8.  Electrolytes are unremarkable.  The blood pressure has markedly improved with fluids.  The patient will need admission for further workup and management.  I consulted Dr. Huel Cote from the hospitalist service; based on our discussion she agrees to evaluate the patient for admission.   FINAL  CLINICAL IMPRESSION(S) / ED DIAGNOSES   Final diagnoses:  Hypotension, unspecified hypotension type     Rx / DC Orders   ED Discharge Orders     None        Note:  This document was prepared using Dragon voice recognition software and may include unintentional dictation errors.    Dionne Bucy, MD 06/21/23 [phone]

## 2023-06-21 NOTE — Assessment & Plan Note (Signed)
 Mild pancytopenia noted, likely due to chemotherapy.  Last differential obtained on 2/18 did not demonstrate any neutropenia.  - Repeat CBC with differential in the a.m.

## 2023-06-21 NOTE — Assessment & Plan Note (Deleted)
 Area involving the left tonsillar fold concerning for mucositis, which may be due to underlying cancer due to chemotherapy or due to radiation.  Unfortunately, he bit his lip due to numbness associated with magic mouthwash that had lidocaine.   - Discussed with pharmacy.  Given multiple agents and Magic mouthwash, may be beneficial to try just plain viscous lidocaine.  Order placed - Trial of low-dose morphine if viscous lidocaine does not provide any relief
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
## 2023-06-21 NOTE — Assessment & Plan Note (Signed)
 No reported chest pain at this time.  - Continue home aspirin

## 2023-06-21 NOTE — Assessment & Plan Note (Signed)
 Last hemoglobin A1c 7.7.  Sugars may rise secondary to steroids.

## 2023-06-21 NOTE — Assessment & Plan Note (Signed)
 Patient reports at least a couple week history of poor p.o. intake due to difficulty with swallowing due to mouth pain.  He suspects this is the cause of his low blood pressure and dizziness, and I agree.  Although there is some concern for CAP, I do not suspect hypotension is due to sepsis.  His blood pressure has recovered quickly with IV fluids.  - S/p 2.25 L bolus - Hold off on further boluses tonight.  If unable to improve p.o. intake over admission, may benefit from maintenance fluids at that time - Hold home antihypertensives

## 2023-06-21 NOTE — ED Triage Notes (Signed)
 First nurse note: Pt here via [facility] from home.  Pt being TX'ed for esophageal CA and had chemo last week and states weakness since.   112/78 HR: 89 96% RA  EtCo2 38 RR16

## 2023-06-21 NOTE — Assessment & Plan Note (Addendum)
 Patient endorses nearly 2 weeks of shortness of breath and a few day history of cough with very subtle findings on chest x-ray concerning for left base opacity.  Given lobar nature, less likely this is viral.  No upper respiratory or GI symptoms reported.  Given he is on chemotherapy and immunocompromise, will treat empirically at this time.  - S/p cefepime and vancomycin - Start Rocephin and azithromycin - Continuous pulse oximetry

## 2023-06-21 NOTE — Assessment & Plan Note (Signed)
 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

## 2023-06-22 ENCOUNTER — Ambulatory Visit: Payer: Medicare Other

## 2023-06-22 ENCOUNTER — Other Ambulatory Visit: Payer: Medicare Other

## 2023-06-22 DIAGNOSIS — I959 Hypotension, unspecified: Secondary | ICD-10-CM | POA: Diagnosis present

## 2023-06-22 DIAGNOSIS — Z7982 Long term (current) use of aspirin: Secondary | ICD-10-CM | POA: Diagnosis not present

## 2023-06-22 DIAGNOSIS — Z515 Encounter for palliative care: Secondary | ICD-10-CM

## 2023-06-22 DIAGNOSIS — K1231 Oral mucositis (ulcerative) due to antineoplastic therapy: Secondary | ICD-10-CM | POA: Diagnosis present

## 2023-06-22 DIAGNOSIS — K1230 Oral mucositis (ulcerative), unspecified: Secondary | ICD-10-CM | POA: Diagnosis present

## 2023-06-22 DIAGNOSIS — E119 Type 2 diabetes mellitus without complications: Secondary | ICD-10-CM | POA: Diagnosis present

## 2023-06-22 DIAGNOSIS — I10 Essential (primary) hypertension: Secondary | ICD-10-CM | POA: Diagnosis present

## 2023-06-22 DIAGNOSIS — S01551A Open bite of lip, initial encounter: Secondary | ICD-10-CM | POA: Diagnosis present

## 2023-06-22 DIAGNOSIS — Z888 Allergy status to other drugs, medicaments and biological substances status: Secondary | ICD-10-CM | POA: Diagnosis not present

## 2023-06-22 DIAGNOSIS — Z923 Personal history of irradiation: Secondary | ICD-10-CM | POA: Diagnosis not present

## 2023-06-22 DIAGNOSIS — K219 Gastro-esophageal reflux disease without esophagitis: Secondary | ICD-10-CM | POA: Diagnosis present

## 2023-06-22 DIAGNOSIS — Z87891 Personal history of nicotine dependence: Secondary | ICD-10-CM | POA: Diagnosis not present

## 2023-06-22 DIAGNOSIS — R1110 Vomiting, unspecified: Secondary | ICD-10-CM | POA: Diagnosis not present

## 2023-06-22 DIAGNOSIS — E861 Hypovolemia: Secondary | ICD-10-CM | POA: Diagnosis not present

## 2023-06-22 DIAGNOSIS — Z955 Presence of coronary angioplasty implant and graft: Secondary | ICD-10-CM | POA: Diagnosis not present

## 2023-06-22 DIAGNOSIS — Z1152 Encounter for screening for COVID-19: Secondary | ICD-10-CM | POA: Diagnosis not present

## 2023-06-22 DIAGNOSIS — J189 Pneumonia, unspecified organism: Secondary | ICD-10-CM | POA: Diagnosis present

## 2023-06-22 DIAGNOSIS — C109 Malignant neoplasm of oropharynx, unspecified: Secondary | ICD-10-CM | POA: Diagnosis present

## 2023-06-22 DIAGNOSIS — D61818 Other pancytopenia: Secondary | ICD-10-CM | POA: Diagnosis not present

## 2023-06-22 DIAGNOSIS — K120 Recurrent oral aphthae: Secondary | ICD-10-CM | POA: Diagnosis present

## 2023-06-22 DIAGNOSIS — Z79899 Other long term (current) drug therapy: Secondary | ICD-10-CM | POA: Diagnosis not present

## 2023-06-22 DIAGNOSIS — I9589 Other hypotension: Secondary | ICD-10-CM | POA: Diagnosis not present

## 2023-06-22 DIAGNOSIS — T451X5A Adverse effect of antineoplastic and immunosuppressive drugs, initial encounter: Secondary | ICD-10-CM | POA: Diagnosis present

## 2023-06-22 DIAGNOSIS — Z7984 Long term (current) use of oral hypoglycemic drugs: Secondary | ICD-10-CM | POA: Diagnosis not present

## 2023-06-22 DIAGNOSIS — D61810 Antineoplastic chemotherapy induced pancytopenia: Secondary | ICD-10-CM | POA: Diagnosis present

## 2023-06-22 DIAGNOSIS — I2510 Atherosclerotic heart disease of native coronary artery without angina pectoris: Secondary | ICD-10-CM | POA: Diagnosis present

## 2023-06-22 DIAGNOSIS — Z801 Family history of malignant neoplasm of trachea, bronchus and lung: Secondary | ICD-10-CM | POA: Diagnosis not present

## 2023-06-22 LAB — BASIC METABOLIC PANEL
Anion gap: 8 (ref 5–15)
BUN: 21 mg/dL (ref 8–23)
CO2: 27 mmol/L (ref 22–32)
Calcium: 7.6 mg/dL — ABNORMAL LOW (ref 8.9–10.3)
Chloride: 102 mmol/L (ref 98–111)
Creatinine, Ser: 0.85 mg/dL (ref 0.61–1.24)
GFR, Estimated: >60 mL/min (ref >60)
Glucose, Bld: 94 mg/dL (ref 70–99)
Potassium: 3.6 mmol/L (ref 3.5–5.1)
Sodium: 137 mmol/L (ref 135–145)

## 2023-06-22 LAB — PROCALCITONIN: Procalcitonin: <0.1 ng/mL

## 2023-06-22 LAB — CBC
HCT: 26.9 % — ABNORMAL LOW (ref 39.0–52.0)
Hemoglobin: 9.9 g/dL — ABNORMAL LOW (ref 13.0–17.0)
MCH: 32.2 pg (ref 26.0–34.0)
MCHC: 36.8 g/dL — ABNORMAL HIGH (ref 30.0–36.0)
MCV: 87.6 fL (ref 80.0–100.0)
Platelets: 102 10*3/uL — ABNORMAL LOW (ref 150–400)
RBC: 3.07 MIL/uL — ABNORMAL LOW (ref 4.22–5.81)
RDW: 12.9 % (ref 11.5–15.5)
WBC: 1.6 10*3/uL — ABNORMAL LOW (ref 4.0–10.5)
nRBC: 0 % (ref 0.0–0.2)

## 2023-06-22 LAB — HIV ANTIBODY (ROUTINE TESTING W REFLEX): HIV Screen 4th Generation wRfx: NONREACTIVE

## 2023-06-22 LAB — GLUCOSE, CAPILLARY: Glucose-Capillary: 84 mg/dL (ref 70–99)

## 2023-06-22 MED ORDER — MAGIC MOUTHWASH
5.0000 mL | Freq: Four times a day (QID) | ORAL | Status: DC | PRN
  Administered 2023-06-22 (×2): 5 mL via ORAL
  Filled 2023-06-22 (×3): qty 5

## 2023-06-22 MED ORDER — PANTOPRAZOLE SODIUM 40 MG PO TBEC
40.0000 mg | DELAYED_RELEASE_TABLET | Freq: Every day | ORAL | Status: DC
  Administered 2023-06-22 – 2023-06-24 (×3): 40 mg via ORAL
  Filled 2023-06-22 (×3): qty 1

## 2023-06-22 MED ORDER — DEXTROSE-SODIUM CHLORIDE 5-0.9 % IV SOLN: INTRAVENOUS | Status: DC

## 2023-06-22 MED ORDER — SUCRALFATE 1 GM/10ML PO SUSP
1.0000 g | Freq: Three times a day (TID) | ORAL | Status: DC
  Administered 2023-06-22 – 2023-06-24 (×7): 1 g via ORAL
  Filled 2023-06-22 (×7): qty 10

## 2023-06-22 MED ORDER — ENSURE ENLIVE PO LIQD
237.0000 mL | Freq: Two times a day (BID) | ORAL | Status: DC
  Administered 2023-06-22 – 2023-06-24 (×3): 237 mL via ORAL

## 2023-06-22 NOTE — Evaluation (Signed)
 Physical Therapy Evaluation Patient Details Name: Carl Waters MRN: [id number] DOB: 31-Dec-1955 Today's Date: 06/22/2023  History of Present Illness  Pt is a 68 y.o. 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  Patient status post chemoradiation.  Last received cycle 6 weekly cisplatin on 06/15/2023.  Finished radiation on 06/18/2023.  Patient was admitted on 06/21/2023 with hypotension and poor oral intake.  Found to have severe mucositis and possible community-acquired pneumonia.  Clinical Impression  Pt A&Ox4, demonstrated significant pain during session with swallowing pills due to his throat sores. Per his report at baseline he is independent. He was able to perform bed mobility modI, sit <> stand and ambulate ~142ft with supervision. BP assessed in supine, sitting, and initial standing, but needs more assessment at 3 minute due to increasing dizziness with prolonged upright (needed a seated rest break prior to walking, and after walkined ~3ft). RN notified. No LOB noted but pt overall appeared to have decreased activity tolerance and endurance, would benefit from further skilled PT intervention to maximize safety and function.         If plan is discharge home, recommend the following: Help with stairs or ramp for entrance;Assist for transportation   Can travel by private vehicle        Equipment Recommendations None recommended by PT  Recommendations for Other Services       Functional Status Assessment Patient has had a recent decline in their functional status and demonstrates the ability to make significant improvements in function in a reasonable and predictable amount of time.     Precautions / Restrictions Precautions Precautions: Fall Restrictions Weight Bearing Restrictions Per Provider Order: No      Mobility  Bed Mobility Overal bed mobility: Modified Independent                  Transfers Overall  transfer level: Needs assistance Equipment used: None Transfers: Sit to/from Stand Sit to Stand: Supervision                Ambulation/Gait Ambulation/Gait assistance: Supervision Gait Distance (Feet): 120 Feet Assistive device: None         General Gait Details: pt did need a seated rest break ~89ft  Stairs            Wheelchair Mobility     Tilt Bed    Modified Rankin (Stroke Patients Only)       Balance Overall balance assessment: Needs assistance Sitting-balance support: Feet supported Sitting balance-Leahy Scale: Good       Standing balance-Leahy Scale: Good                               Pertinent Vitals/Pain Pain Assessment Pain Assessment: Faces Faces Pain Scale: Hurts whole lot Pain Location: throat Pain Descriptors / Indicators: Sore, Grimacing, Guarding Pain Intervention(s): Limited activity within patient's tolerance, Monitored during session, Repositioned    Home Living Family/patient expects to be discharged to:: Private residence Living Arrangements: Alone Available Help at Discharge: Family Type of Home: Mobile home Home Access: Stairs to enter Entrance Stairs-Rails: Can reach both Entrance Stairs-Number of Steps: 5   Home Layout: One level Home Equipment: None      Prior Function Prior Level of Function : Independent/Modified Independent                     Extremity/Trunk Assessment   Upper Extremity  Assessment Upper Extremity Assessment: Generalized weakness    Lower Extremity Assessment Lower Extremity Assessment: Generalized weakness       Communication        Cognition Arousal: Alert Behavior During Therapy: WFL for tasks assessed/performed   PT - Cognitive impairments: No apparent impairments                                 Cueing       General Comments      Exercises     Assessment/Plan    PT Assessment Patient needs continued PT services  PT Problem List  Decreased strength;Decreased activity tolerance;Decreased balance;Decreased mobility       PT Treatment Interventions DME instruction;Balance training;Gait training;Neuromuscular re-education;Stair training;Functional mobility training;Patient/family education;Therapeutic activities;Therapeutic exercise    PT Goals (Current goals can be found in the Care Plan section)  Acute Rehab PT Goals Patient Stated Goal: to feel better PT Goal Formulation: With patient Time For Goal Achievement: 07/06/23 Potential to Achieve Goals: Good    Frequency Min 1X/week     Co-evaluation               AM-PAC PT "6 Clicks" Mobility  Outcome Measure Help needed turning from your back to your side while in a flat bed without using bedrails?: None Help needed moving from lying on your back to sitting on the side of a flat bed without using bedrails?: None Help needed moving to and from a bed to a chair (including a wheelchair)?: None Help needed standing up from a chair using your arms (e.g., wheelchair or bedside chair)?: A Little Help needed to walk in hospital room?: A Little Help needed climbing 3-5 steps with a railing? : A Little 6 Click Score: 21    End of Session Equipment Utilized During Treatment: Gait belt Activity Tolerance: Other (comment) (limited by fatigue, dizziness) Patient left: in bed;with call bell/phone within reach Nurse Communication: Mobility status PT Visit Diagnosis: Other abnormalities of gait and mobility (R26.89);Difficulty in walking, not elsewhere classified (R26.2);Muscle weakness (generalized) (M62.81)    Time: 9147-8295 PT Time Calculation (min) (ACUTE ONLY): 18 min   Charges:   PT Evaluation $PT Eval Low Complexity: 1 Low PT Treatments $Therapeutic Activity: 8-22 mins PT General Charges $$ ACUTE PT VISIT: 1 Visit        Olga Coaster PT, DPT 2:39 PM,06/22/23
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
## 2023-06-22 NOTE — Plan of Care (Signed)
  Problem: Education: Goal: Knowledge of General Education information will improve Description: Including pain rating scale, medication(s)/side effects and non-pharmacologic comfort measures Outcome: Progressing   Problem: Nutrition: Goal: Adequate nutrition will be maintained Outcome: Progressing   Problem: Coping: Goal: Level of anxiety will decrease Outcome: Progressing   Problem: Pain Managment: Goal: General experience of comfort will improve and/or be controlled Outcome: Progressing   Problem: Safety: Goal: Ability to remain free from injury will improve Outcome: Progressing   Problem: Skin Integrity: Goal: Risk for impaired skin integrity will decrease Outcome: Progressing
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
## 2023-06-23 DIAGNOSIS — C109 Malignant neoplasm of oropharynx, unspecified: Secondary | ICD-10-CM | POA: Diagnosis not present

## 2023-06-23 DIAGNOSIS — I9589 Other hypotension: Secondary | ICD-10-CM | POA: Diagnosis not present

## 2023-06-23 DIAGNOSIS — J189 Pneumonia, unspecified organism: Secondary | ICD-10-CM | POA: Diagnosis not present

## 2023-06-23 DIAGNOSIS — K1230 Oral mucositis (ulcerative), unspecified: Secondary | ICD-10-CM | POA: Diagnosis not present

## 2023-06-23 LAB — HEMOGLOBIN A1C
Hgb A1c MFr Bld: 7.7 % — ABNORMAL HIGH (ref 4.8–5.6)
Mean Plasma Glucose: 174 mg/dL

## 2023-06-23 LAB — VITAMIN B12: Vitamin B-12: 383 pg/mL (ref 180–914)

## 2023-06-23 LAB — GLUCOSE, CAPILLARY: Glucose-Capillary: 107 mg/dL — ABNORMAL HIGH (ref 70–99)

## 2023-06-23 MED ORDER — DEXAMETHASONE SODIUM PHOSPHATE 10 MG/ML IJ SOLN
10.0000 mg | INTRAMUSCULAR | Status: DC
  Administered 2023-06-24: 10 mg via INTRAVENOUS
  Filled 2023-06-23: qty 1

## 2023-06-23 MED ORDER — MAGNESIUM SULFATE 2 GM/50ML IV SOLN
2.0000 g | Freq: Once | INTRAVENOUS | Status: AC
  Administered 2023-06-23: 2 g via INTRAVENOUS
  Filled 2023-06-23: qty 50

## 2023-06-23 MED ORDER — BENZOCAINE 10 % MT GEL
Freq: Three times a day (TID) | OROMUCOSAL | Status: DC
  Filled 2023-06-23: qty 9

## 2023-06-23 MED ORDER — DEXAMETHASONE SODIUM PHOSPHATE 10 MG/ML IJ SOLN
10.0000 mg | Freq: Once | INTRAMUSCULAR | Status: AC
  Administered 2023-06-23: 10 mg via INTRAVENOUS
  Filled 2023-06-23: qty 1

## 2023-06-23 NOTE — Plan of Care (Signed)
  Problem: Education: Goal: Knowledge of General Education information will improve Description: Including pain rating scale, medication(s)/side effects and non-pharmacologic comfort measures Outcome: Progressing   Problem: Clinical Measurements: Goal: Will remain free from infection Outcome: Progressing   Problem: Nutrition: Goal: Adequate nutrition will be maintained Outcome: Progressing   Problem: Pain Managment: Goal: General experience of comfort will improve and/or be controlled Outcome: Progressing   Problem: Safety: Goal: Ability to remain free from injury will improve Outcome: Progressing   Problem: Skin Integrity: Goal: Risk for impaired skin integrity will decrease Outcome: Progressing

## 2023-06-23 NOTE — Assessment & Plan Note (Signed)
 Mouth looks like aphthous ulcers.  Trial of Anbesol and Decadron.

## 2023-06-23 NOTE — Progress Notes (Signed)
  Progress Note   Patient: Carl Waters GNF:[id number] DOB: 09/15/1955 DOA: 06/21/2023     1 DOS: the patient was seen and examined on 06/23/2023   Brief hospital course: 68 year old man with squamous cell carcinoma of the oropharynx, recently completed chemotherapy and radiation.  Also was HPV positive.  History of CAD and polysubstance abuse hypertension presented to the ED with weakness and cough.  Patient having some dizziness especially with standing and walking.  Having poor oral intake.  Mouth is very painful and hurts to swallow.  2/26.  Aphthous ulcers seen on his tongue on the left and back of the throat.  Anbesol ordered.  Magic mouthwash discontinued.  Patient vomited lunch.  Continue antibiotics for pneumonia.  Assessment and Plan: * Hypotension Improved.  Holding antihypertensive medication.  CAP (community acquired pneumonia) On Rocephin and Zithromax  Mucositis Mouth looks like aphthous ulcers.  Trial of Anbesol and Decadron.  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  Follow-up oncology as outpatient.  Pancytopenia (HCC) Likely secondary to recent chemo.  Type 2 diabetes mellitus (HCC) Last hemoglobin A1c 7.7.  Sugars may rise secondary to steroids.  Essential hypertension - Hold home antihypertensives given hypotension in the setting of poor p.o. intake  CAD (coronary artery disease), native coronary artery No reported chest pain at this time.  - Continue home aspirin        Subjective: Patient with very poor appetite.  Stated he vomited lunch.  Headache improved from this morning after IV magnesium and IV Decadron.  Admitted with weakness and found to have pneumonia and aphthous ulcers in his mouth.  Physical Exam: Vitals:   06/22/23 1439 06/22/23 2147 06/23/23 0421 06/23/23 0927  BP: 122/74 (!) 140/85 138/83 131/81  Pulse: 71 76 71 71  Resp: 18  18 18 18   Temp: 99 F (37.2 C) 98.5 F (36.9 C) 98.2 F (36.8 C) 98.5 F (36.9 C)  TempSrc:      SpO2: 96% 97% 97% 97%  Weight:      Height:       Physical Exam Pulmonary:     Breath sounds: Examination of the right-lower field reveals decreased breath sounds. Examination of the left-lower field reveals decreased breath sounds. Decreased breath sounds present. No wheezing, rhonchi or rales.  Abdominal:     Palpations: Abdomen is soft.     Tenderness: There is no abdominal tenderness.  Musculoskeletal:     Right lower leg: No swelling.     Left lower leg: No swelling.  Skin:    General: Skin is warm.     Findings: No rash.  Neurological:     Mental Status: He is alert and oriented to person, place, and time.     Data Reviewed: Creatinine 0.85, procalcitonin negative, vitamin B12 383, white blood cell count 1.6, hemoglobin 9.9, platelet count 102   Disposition: Status is: Inpatient Remains inpatient appropriate because: Patient vomited up lunch.  Will reevaluate tomorrow.  Planned Discharge Destination: Home    Time spent: 28 minutes  Author: Alford Highland, MD 06/23/2023 2:08 PM  For on call review [email or web address].

## 2023-06-23 NOTE — Hospital Course (Signed)
 68 year old man with squamous cell carcinoma of the oropharynx, recently completed chemotherapy and radiation.  Also was HPV positive.  History of CAD and polysubstance abuse hypertension presented to the ED with weakness and cough.  Patient having some dizziness especially with standing and walking.  Having poor oral intake.  Mouth is very painful and hurts to swallow.  2/26.  Aphthous ulcers seen on his tongue on the left and back of the throat.  Anbesol ordered.  Magic mouthwash discontinued.  Patient vomited lunch.  Continue antibiotics for pneumonia. 2/27.  Mouth feeling better and is eating better.  Wanting to go home today.

## 2023-06-23 NOTE — TOC Initial Note (Signed)
 Transition of Care [facility]) - Initial/Assessment Note    Patient Details  Name: Carl Waters MRN: [id number] Date of Birth: Mar 05, 1956  [facility]) CM/SW Contact:    Erin Sons, LCSW Phone Number: 06/23/2023, 4:41 PM  Clinical Narrative:                   CSW met with pt to discuss HH recs. Pt lives at home alone in [place] at address listed in chart. Pt is agreeable to CSW arranging HH with no preference. He would also like a walker  TOC will follow to arrange [facility] and walker.       Activities of Daily Living   ADL Screening (condition at time of admission) Independently performs ADLs?: Yes (appropriate for developmental age) Is the patient deaf or have difficulty hearing?: No Does the patient have difficulty seeing, even when wearing glasses/contacts?: No Does the patient have difficulty concentrating, remembering, or making decisions?: No  Permission Sought/Granted                  Emotional Assessment              Admission diagnosis:  Hypotension [I95.9] Hypotension, unspecified hypotension type [I95.9] Mucositis due to chemotherapy [K12.31] Patient Active Problem List   Diagnosis Date Noted   Palliative care encounter 06/22/2023   Mucositis 06/22/2023   Hypotension 06/21/2023   CAP (community acquired pneumonia) 06/21/2023   Myocardial infarction (HCC) 06/21/2023   Pancytopenia (HCC) 06/21/2023   Encounter for antineoplastic chemotherapy 05/04/2023   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   Goals of care, counseling/discussion 03/31/2023   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   Hx of cocaine abuse (HCC) 12/22/2022   Acute pancreatitis without necrosis or infection, unspecified 08/21/2022   Eustachian tube dysfunction, right 06/06/2019   Hypocalcemia 02/23/2019   Middle cerebral artery stenosis, 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 [place] [address] Phone: [phone] Fax: [phone]     Social Drivers of Health (SDOH) Social History: SDOH Screenings   Food Insecurity: No Food Insecurity (06/21/2023)  Housing: Low Risk  (06/23/2023)  Recent Concern: Housing - High Risk (04/30/2023)  Transportation Needs: No Transportation Needs (06/21/2023)  Utilities: Not At Risk (06/21/2023)  Depression (PHQ2-9): Low Risk  (05/04/2023)  Financial Resource Strain: Low Risk  (05/21/2020)   [facility], [facility]  Physical Activity: Inactive (05/04/2023)  Social Connections: Moderately Isolated (06/21/2023)  Tobacco Use: Medium Risk (06/21/2023)   SDOH Interventions:     Readmission Risk Interventions     No data to display

## 2023-06-24 ENCOUNTER — Other Ambulatory Visit: Payer: Self-pay

## 2023-06-24 ENCOUNTER — Encounter: Payer: Self-pay | Admitting: Oncology

## 2023-06-24 DIAGNOSIS — I9589 Other hypotension: Secondary | ICD-10-CM | POA: Diagnosis not present

## 2023-06-24 DIAGNOSIS — J189 Pneumonia, unspecified organism: Secondary | ICD-10-CM | POA: Diagnosis not present

## 2023-06-24 DIAGNOSIS — K1230 Oral mucositis (ulcerative), unspecified: Secondary | ICD-10-CM | POA: Diagnosis not present

## 2023-06-24 DIAGNOSIS — C109 Malignant neoplasm of oropharynx, unspecified: Secondary | ICD-10-CM

## 2023-06-24 DIAGNOSIS — D61818 Other pancytopenia: Secondary | ICD-10-CM | POA: Diagnosis not present

## 2023-06-24 LAB — GLUCOSE, CAPILLARY: Glucose-Capillary: 117 mg/dL — ABNORMAL HIGH (ref 70–99)

## 2023-06-24 LAB — BASIC METABOLIC PANEL
Anion gap: 9 (ref 5–15)
BUN: 10 mg/dL (ref 8–23)
CO2: 27 mmol/L (ref 22–32)
Calcium: 7.5 mg/dL — ABNORMAL LOW (ref 8.9–10.3)
Chloride: 98 mmol/L (ref 98–111)
Creatinine, Ser: 0.75 mg/dL (ref 0.61–1.24)
GFR, Estimated: >60 mL/min (ref >60)
Glucose, Bld: 91 mg/dL (ref 70–99)
Potassium: 3.5 mmol/L (ref 3.5–5.1)
Sodium: 134 mmol/L — ABNORMAL LOW (ref 135–145)

## 2023-06-24 LAB — CBC
HCT: 25.3 % — ABNORMAL LOW (ref 39.0–52.0)
Hemoglobin: 9.1 g/dL — ABNORMAL LOW (ref 13.0–17.0)
MCH: 31.1 pg (ref 26.0–34.0)
MCHC: 36 g/dL (ref 30.0–36.0)
MCV: 86.3 fL (ref 80.0–100.0)
Platelets: 105 10*3/uL — ABNORMAL LOW (ref 150–400)
RBC: 2.93 MIL/uL — ABNORMAL LOW (ref 4.22–5.81)
RDW: 12.9 % (ref 11.5–15.5)
WBC: 2.3 10*3/uL — ABNORMAL LOW (ref 4.0–10.5)
nRBC: 0 % (ref 0.0–0.2)

## 2023-06-24 MED ORDER — HEPARIN SOD (PORK) LOCK FLUSH 100 UNIT/ML IV SOLN
500.0000 [IU] | Freq: Once | INTRAVENOUS | Status: DC
  Filled 2023-06-24: qty 5

## 2023-06-24 MED ORDER — PANTOPRAZOLE SODIUM 40 MG PO TBEC
40.0000 mg | DELAYED_RELEASE_TABLET | Freq: Every day | ORAL | 0 refills | Status: AC
  Filled 2023-06-24: qty 30, 30d supply, fill #0

## 2023-06-24 MED ORDER — ENSURE ENLIVE PO LIQD
237.0000 mL | Freq: Two times a day (BID) | ORAL | 0 refills | Status: AC
  Filled 2023-06-24: qty 14220, 30d supply, fill #0

## 2023-06-24 MED ORDER — AZITHROMYCIN 250 MG PO TABS
250.0000 mg | ORAL_TABLET | Freq: Every day | ORAL | 0 refills | Status: DC
  Filled 2023-06-24: qty 3, 3d supply, fill #0

## 2023-06-24 MED ORDER — BENZOCAINE 10 % MT GEL
Freq: Three times a day (TID) | OROMUCOSAL | 0 refills | Status: AC
  Filled 2023-06-24: qty 5.3, fill #0

## 2023-06-24 MED ORDER — AMOXICILLIN-POT CLAVULANATE 875-125 MG PO TABS
1.0000 | ORAL_TABLET | Freq: Two times a day (BID) | ORAL | 0 refills | Status: AC
  Filled 2023-06-24: qty 6, 3d supply, fill #0

## 2023-06-24 MED ORDER — PREDNISONE 10 MG PO TABS
ORAL_TABLET | ORAL | 0 refills | Status: AC
  Filled 2023-06-24: qty 17, 9d supply, fill #0

## 2023-06-24 NOTE — TOC Progression Note (Addendum)
 Transition of Care [facility]) - Progression Note    Patient Details  Name: Carl Waters MRN: [id number] Date of Birth: 03-16-1956  Transition of Care [facility]) CM/SW Contact  Erin Sons, [place] Phone Number: 06/24/2023, 10:09 AM  Clinical Narrative:      HH is arranged with [facility]. Rolling walker ordered from [employer] to be delivered to pt room.    1127: [employer] voucher given to nurse.     Expected Discharge Plan and Services         Expected Discharge Date: 06/24/23                                     Social Determinants of Health (SDOH) Interventions SDOH Screenings   Food Insecurity: No Food Insecurity (06/21/2023)  Housing: Low Risk  (06/23/2023)  Recent Concern: Housing - High Risk (04/30/2023)  Transportation Needs: No Transportation Needs (06/21/2023)  Utilities: Not At Risk (06/21/2023)  Depression (PHQ2-9): Low Risk  (05/04/2023)  Financial Resource Strain: Low Risk  (05/21/2020)   [facility] from [facility], [facility]  Physical Activity: Inactive (05/04/2023)  Social Connections: Moderately Isolated (06/21/2023)  Tobacco Use: Medium Risk (06/21/2023)    Readmission Risk Interventions     No data to display
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
## 2023-06-24 NOTE — Plan of Care (Signed)

## 2023-06-24 NOTE — Progress Notes (Signed)
 06/24/2023  Carl Waters DOB: [birth year] MRN: [id number]   RIDER WAIVER AND RELEASE OF LIABILITY  For the purposes of helping with transportation needs, [facility] partners with outside transportation providers (taxi companies, [place], Catering manager.) to give [employer] patients or other approved people the choice of on-demand rides [employer]") to our buildings for non-emergency visits.  By using [employer], I, the person signing this document, on behalf of myself and/or any legal minors (in my care using the [employer]), agree:  Science writer given to me are supplied by independent, outside transportation providers who do not work for, or have any affiliation with, [employer]. [facility] is not a transportation company. [facility] has no control over the quality or safety of the rides I get using [employer]. [facility] has no control over whether any outside ride will happen on time or not. [facility] gives no guarantee on the reliability, quality, safety, or availability on any rides, or that no mistakes will happen. I know and accept that traveling by vehicle (car, truck, SVU, Zenaida Niece, bus, taxi, etc.) has risks of serious injuries such as disability, being paralyzed, and death. I know and agree the risk of using [employer] is mine alone, and not [employer]. Transport Services are provided "as is" and as are available. The transportation providers are in charge for all inspections and care of the vehicles used to provide these rides. I agree not to take legal action against [facility], its agents, employees, officers, directors, representatives, insurers, attorneys, assigns, successors, subsidiaries, and affiliates at any time for any reasons related directly or indirectly to using [employer]. I also agree not to take legal action against [facility] or its affiliates for any injury, death, or damage to property caused by or related to using  [employer]. I have read this Waiver and Release of Liability, and I understand the terms used in it and their legal meaning. This Waiver is freely and voluntarily given with the understanding that my right (or any legal minors) to legal action against [place] relating to [employer] is knowingly given up to use these services.   I attest that I read the Ride Waiver and Release of Liability to Carl Waters, gave Mr. Loftus the opportunity to ask questions and answered the questions asked (if any). I affirm that Carl Waters then provided consent for assistance with transportation.

## 2023-06-26 LAB — CULTURE, BLOOD (ROUTINE X 2)
Culture: NO GROWTH
Culture: NO GROWTH
Special Requests: ADEQUATE

## 2023-06-29 ENCOUNTER — Telehealth: Payer: Self-pay | Admitting: *Deleted

## 2023-06-29 NOTE — TOC Progression Note (Signed)
 Transition of Care [facility]) - Progression Note    Patient Details  Name: Carl Waters MRN: [id number] Date of Birth: [birth year]  [facility] CM/SW Contact  Marlowe Sax, RN Phone Number: 06/29/2023, 11:58 AM  Clinical Narrative:     Patient called asking for Home health inform,ation, I provided him with [facility] phone number he stated that he wanted a rollator I explained that he will have to go thru his PCP to get one since he was provided with a rolling walker at DC here        Expected Discharge Plan and Services         Expected Discharge Date: 06/24/23                                     Social Determinants of Health (SDOH) Interventions SDOH Screenings   Food Insecurity: No Food Insecurity (06/21/2023)  Housing: Low Risk  (06/23/2023)  Recent Concern: Housing - High Risk (04/30/2023)  Transportation Needs: No Transportation Needs (06/21/2023)  Utilities: Not At Risk (06/21/2023)  Depression (PHQ2-9): Low Risk  (05/04/2023)  Financial Resource Strain: Low Risk  (05/21/2020)   Received from [facility], [facility]  Physical Activity: Inactive (05/04/2023)  Social Connections: Moderately Isolated (06/21/2023)  Tobacco Use: Medium Risk (06/21/2023)    Readmission Risk Interventions     No data to display

## 2023-06-29 NOTE — Telephone Encounter (Signed)
 I am ok to approve it

## 2023-06-29 NOTE — Telephone Encounter (Signed)
 Arline Asp states that the PT order was not a provider that will being in the future. She wants to see if Carl Waters would approve it or the PCP. Her telephone is [phone] to call back with yes or no

## 2023-06-30 ENCOUNTER — Telehealth: Payer: Self-pay

## 2023-06-30 ENCOUNTER — Inpatient Hospital Stay (HOSPITAL_BASED_OUTPATIENT_CLINIC_OR_DEPARTMENT_OTHER): Payer: Medicare Other | Admitting: Hospice and Palliative Medicine

## 2023-06-30 ENCOUNTER — Inpatient Hospital Stay: Payer: Medicare Other

## 2023-06-30 ENCOUNTER — Inpatient Hospital Stay: Payer: Medicare Other | Attending: Oncology

## 2023-06-30 VITALS — BP 114/69 | Temp 98.2°F | Resp 17 | Wt 168.0 lb

## 2023-06-30 DIAGNOSIS — E876 Hypokalemia: Secondary | ICD-10-CM | POA: Diagnosis not present

## 2023-06-30 DIAGNOSIS — Z9221 Personal history of antineoplastic chemotherapy: Secondary | ICD-10-CM | POA: Diagnosis not present

## 2023-06-30 DIAGNOSIS — Z923 Personal history of irradiation: Secondary | ICD-10-CM | POA: Insufficient documentation

## 2023-06-30 DIAGNOSIS — Z452 Encounter for adjustment and management of vascular access device: Secondary | ICD-10-CM | POA: Insufficient documentation

## 2023-06-30 DIAGNOSIS — Z79899 Other long term (current) drug therapy: Secondary | ICD-10-CM | POA: Insufficient documentation

## 2023-06-30 DIAGNOSIS — C109 Malignant neoplasm of oropharynx, unspecified: Secondary | ICD-10-CM

## 2023-06-30 DIAGNOSIS — Z7952 Long term (current) use of systemic steroids: Secondary | ICD-10-CM | POA: Insufficient documentation

## 2023-06-30 DIAGNOSIS — J45909 Unspecified asthma, uncomplicated: Secondary | ICD-10-CM | POA: Insufficient documentation

## 2023-06-30 DIAGNOSIS — K1230 Oral mucositis (ulcerative), unspecified: Secondary | ICD-10-CM | POA: Insufficient documentation

## 2023-06-30 LAB — CMP (CANCER CENTER ONLY)
ALT: 33 U/L (ref 0–44)
AST: 28 U/L (ref 15–41)
Albumin: 3 g/dL — ABNORMAL LOW (ref 3.5–5.0)
Alkaline Phosphatase: 45 U/L (ref 38–126)
Anion gap: 7 (ref 5–15)
BUN: 18 mg/dL (ref 8–23)
CO2: 27 mmol/L (ref 22–32)
Calcium: 7.5 mg/dL — ABNORMAL LOW (ref 8.9–10.3)
Chloride: 103 mmol/L (ref 98–111)
Creatinine: 0.65 mg/dL (ref 0.61–1.24)
GFR, Estimated: >60 mL/min (ref >60)
Glucose, Bld: 117 mg/dL — ABNORMAL HIGH (ref 70–99)
Potassium: 3.2 mmol/L — ABNORMAL LOW (ref 3.5–5.1)
Sodium: 137 mmol/L (ref 135–145)
Total Bilirubin: 0.7 mg/dL (ref 0.0–1.2)
Total Protein: 5.7 g/dL — ABNORMAL LOW (ref 6.5–8.1)

## 2023-06-30 LAB — CBC WITH DIFFERENTIAL (CANCER CENTER ONLY)
Abs Immature Granulocytes: 0.04 10*3/uL (ref 0.00–0.07)
Basophils Absolute: 0 10*3/uL (ref 0.0–0.1)
Basophils Relative: 0 %
Eosinophils Absolute: 0 10*3/uL (ref 0.0–0.5)
Eosinophils Relative: 0 %
HCT: 26.7 % — ABNORMAL LOW (ref 39.0–52.0)
Hemoglobin: 9.4 g/dL — ABNORMAL LOW (ref 13.0–17.0)
Immature Granulocytes: 1 %
Lymphocytes Relative: 21 %
Lymphs Abs: 0.8 10*3/uL (ref 0.7–4.0)
MCH: 32 pg (ref 26.0–34.0)
MCHC: 35.2 g/dL (ref 30.0–36.0)
MCV: 90.8 fL (ref 80.0–100.0)
Monocytes Absolute: 0.8 10*3/uL (ref 0.1–1.0)
Monocytes Relative: 19 %
Neutro Abs: 2.4 10*3/uL (ref 1.7–7.7)
Neutrophils Relative %: 59 %
Platelet Count: 113 10*3/uL — ABNORMAL LOW (ref 150–400)
RBC: 2.94 MIL/uL — ABNORMAL LOW (ref 4.22–5.81)
RDW: 15.9 % — ABNORMAL HIGH (ref 11.5–15.5)
WBC Count: 4 10*3/uL (ref 4.0–10.5)
nRBC: 0 % (ref 0.0–0.2)

## 2023-06-30 LAB — MAGNESIUM: Magnesium: 1.4 mg/dL — ABNORMAL LOW (ref 1.7–2.4)

## 2023-06-30 MED ORDER — POTASSIUM CHLORIDE 20 MEQ/100ML IV SOLN
20.0000 meq | Freq: Once | INTRAVENOUS | Status: AC
  Administered 2023-06-30: 20 meq via INTRAVENOUS

## 2023-06-30 MED ORDER — MAGNESIUM SULFATE 2 GM/50ML IV SOLN
2.0000 g | Freq: Once | INTRAVENOUS | Status: AC
  Administered 2023-06-30: 2 g via INTRAVENOUS
  Filled 2023-06-30: qty 50

## 2023-06-30 MED ORDER — MAGNESIUM CHLORIDE 64 MG PO TBEC: 1.0000 | DELAYED_RELEASE_TABLET | Freq: Every day | ORAL | 0 refills | Status: AC

## 2023-06-30 MED ORDER — SODIUM CHLORIDE 0.9 % IV SOLN
INTRAVENOUS | Status: DC
  Filled 2023-06-30: qty 250

## 2023-06-30 MED ORDER — HEPARIN SOD (PORK) LOCK FLUSH 100 UNIT/ML IV SOLN
500.0000 [IU] | Freq: Once | INTRAVENOUS | Status: AC
  Administered 2023-06-30: 500 [IU] via INTRAVENOUS
  Filled 2023-06-30: qty 5

## 2023-06-30 MED ORDER — SODIUM CHLORIDE 0.9% FLUSH
10.0000 mL | Freq: Once | INTRAVENOUS | Status: AC
  Administered 2023-06-30: 10 mL via INTRAVENOUS
  Filled 2023-06-30: qty 10

## 2023-06-30 MED ORDER — POTASSIUM CHLORIDE CRYS ER 20 MEQ PO TBCR: 20.0000 meq | EXTENDED_RELEASE_TABLET | Freq: Every day | ORAL | 0 refills | Status: AC

## 2023-06-30 NOTE — Telephone Encounter (Signed)
 Message received from [facility] that order has been accepted and in process

## 2023-06-30 NOTE — Patient Instructions (Signed)
 Magnesium Sulfate Injection What is this medication? MAGNESIUM SULFATE (mag NEE zee um SUL fate) prevents and treats low levels of magnesium in your body. It may also be used to prevent and treat seizures during pregnancy in people with high blood pressure disorders, such as preeclampsia or eclampsia. Magnesium plays an important role in maintaining the health of your muscles and nervous system. This medicine may be used for other purposes; ask your health care provider or pharmacist if you have questions. What should I tell my care team before I take this medication? They need to know if you have any of these conditions: Heart disease History of irregular heart beat Kidney disease An unusual or allergic reaction to magnesium sulfate, medications, foods, dyes, or preservatives Pregnant or trying to get pregnant Breast-feeding How should I use this medication? This medication is for infusion into a vein. It is given in a hospital or clinic setting. Talk to your care team about the use of this medication in children. While this medication may be prescribed for selected conditions, precautions do apply. Overdosage: If you think you have taken too much of this medicine contact a poison control center or emergency room at once. NOTE: This medicine is only for you. Do not share this medicine with others. What if I miss a dose? This does not apply. What may interact with this medication? Certain medications for anxiety or sleep Certain medications for seizures, such phenobarbital Digoxin Medications that relax muscles for surgery Narcotic medications for pain This list may not describe all possible interactions. Give your health care provider a list of all the medicines, herbs, non-prescription drugs, or dietary supplements you use. Also tell them if you smoke, drink alcohol, or use illegal drugs. Some items may interact with your medicine. What should I watch for while using this  medication? Your condition will be monitored carefully while you are receiving this medication. You may need blood work done while you are receiving this medication. What side effects may I notice from receiving this medication? Side effects that you should report to your care team as soon as possible: Allergic reactions--skin rash, itching, hives, swelling of the face, lips, tongue, or throat High magnesium level--confusion, drowsiness, facial flushing, redness, sweating, muscle weakness, fast or irregular heartbeat, trouble breathing Low blood pressure--dizziness, feeling faint or lightheaded, blurry vision Side effects that usually do not require medical attention (report to your care team if they continue or are bothersome): Headache Nausea This list may not describe all possible side effects. Call your doctor for medical advice about side effects. You may report side effects to FDA at 1-800-FDA-1088. Where should I keep my medication? This medication is given in a hospital or clinic and will not be stored at home. NOTE: This sheet is a summary. It may not cover all possible information. If you have questions about this medicine, talk to your doctor, pharmacist, or health care provider.  2024 Elsevier/Gold Standard (2020-12-25 00:00:00) Potassium Chloride Injection What is this medication? POTASSIUM CHLORIDE (poe TASS i um KLOOR ide) prevents and treats low levels of potassium in your body. Potassium plays an important role in maintaining the health of your kidneys, heart, muscles, and nervous system. This medicine may be used for other purposes; ask your health care provider or pharmacist if you have questions. COMMON BRAND NAME(S): PROAMP What should I tell my care team before I take this medication? They need to know if you have any of these conditions: Addison disease Dehydration Diabetes (high blood  sugar) Heart disease High levels of potassium in the blood Irregular heartbeat or  rhythm Kidney disease Large areas of burned skin An unusual or allergic reaction to potassium, other medications, foods, dyes, or preservatives Pregnant or trying to get pregnant Breast-feeding How should I use this medication? This medication is injected into a vein. It is given in a hospital or clinic setting. Talk to your care team about the use of this medication in children. Special care may be needed. Overdosage: If you think you have taken too much of this medicine contact a poison control center or emergency room at once. NOTE: This medicine is only for you. Do not share this medicine with others. What if I miss a dose? This does not apply. This medication is not for regular use. What may interact with this medication? Do not take this medication with any of the following: Certain diuretics, such as spironolactone, triamterene Eplerenone Sodium polystyrene sulfonate This medication may also interact with the following: Certain medications for blood pressure or heart disease, such as lisinopril, losartan, quinapril, valsartan Medications that lower your chance of fighting infection, such as cyclosporine, tacrolimus NSAIDs, medications for pain and inflammation, such as ibuprofen or naproxen Other potassium supplements Salt substitutes This list may not describe all possible interactions. Give your health care provider a list of all the medicines, herbs, non-prescription drugs, or dietary supplements you use. Also tell them if you smoke, drink alcohol, or use illegal drugs. Some items may interact with your medicine. What should I watch for while using this medication? Visit your care team for regular checks on your progress. Tell your care team if your symptoms do not start to get better or if they get worse. You may need blood work while you are taking this medication. Avoid salt substitutes unless you are told otherwise by your care team. What side effects may I notice from  receiving this medication? Side effects that you should report to your care team as soon as possible: Allergic reactions--skin rash, itching, hives, swelling of the face, lips, tongue, or throat High potassium level--muscle weakness, fast or irregular heartbeat Side effects that usually do not require medical attention (report to your care team if they continue or are bothersome): Diarrhea Nausea Stomach pain Vomiting This list may not describe all possible side effects. Call your doctor for medical advice about side effects. You may report side effects to FDA at 1-800-FDA-1088. Where should I keep my medication? This medication is given in a hospital or clinic. It will not be stored at home. NOTE: This sheet is a summary. It may not cover all possible information. If you have questions about this medicine, talk to your doctor, pharmacist, or health care provider.  2024 Elsevier/Gold Standard (2021-10-24 00:00:00)
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
## 2023-06-30 NOTE — Progress Notes (Signed)
 Patient reports feeling well other than stable leg weakness.  No falls since hospital discharge but does have to hold on the the walls while ambulating around the house.  Is requesting HH and a rollator walker for safety. Occasional dyspnea on exertion.   Not taking Zoloft on daily basis but as PRN.  Would like to discuss need for bp meds that is currently on hold, bp today 114/69.

## 2023-06-30 NOTE — Telephone Encounter (Addendum)
 Order submitted to [employer] via [employer] portal for rollator walker.

## 2023-06-30 NOTE — Telephone Encounter (Signed)
 Call placed to [facility] with Frances Furbish to inform her Dr. Zamaria Brazzle Robert will sign HH/PT orders.

## 2023-07-06 ENCOUNTER — Telehealth: Payer: Self-pay

## 2023-07-06 NOTE — Telephone Encounter (Signed)
 Calling to request verbal order for PT once a week x 5 weeks and order to refer to [place] social work since pt lives alone there may be some beneficial resources.    Verbal order given

## 2023-07-07 ENCOUNTER — Encounter: Payer: Self-pay | Admitting: Oncology

## 2023-07-07 NOTE — Telephone Encounter (Signed)
 Delivery successful per [employer] portal.

## 2023-07-11 ENCOUNTER — Other Ambulatory Visit: Payer: Self-pay

## 2023-07-12 ENCOUNTER — Ambulatory Visit
Admission: RE | Admit: 2023-07-12 | Discharge: 2023-07-12 | Disposition: A | Discharge status: Home / Self Care | Payer: Medicare Other | Source: Ambulatory Visit | Attending: Radiation Oncology | Admitting: Radiation Oncology

## 2023-07-12 ENCOUNTER — Encounter: Payer: Self-pay | Admitting: Radiation Oncology

## 2023-07-12 VITALS — BP 141/102 | HR 100 | Temp 97.0°F | Resp 18 | Ht 72.0 in | Wt 175.9 lb

## 2023-07-12 DIAGNOSIS — Z923 Personal history of irradiation: Secondary | ICD-10-CM | POA: Insufficient documentation

## 2023-07-12 DIAGNOSIS — C109 Malignant neoplasm of oropharynx, unspecified: Secondary | ICD-10-CM | POA: Insufficient documentation

## 2023-07-12 DIAGNOSIS — Z9221 Personal history of antineoplastic chemotherapy: Secondary | ICD-10-CM | POA: Insufficient documentation

## 2023-07-12 NOTE — Progress Notes (Signed)
 Radiation Oncology Follow up Note  Name: Carl Waters   Date:   07/12/2023 MRN:  [id number] DOB: 09/14/1955    This 68 y.o. 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  Seen today in routine follow-up he is doing well specifically denies any oral pain dysphagia or discomfort.  Does have still some tenderness where his molar was pulled about 2 months ago.  His taste is returning..  COMPLICATIONS OF TREATMENT: none  FOLLOW UP COMPLIANCE: keeps appointments   PHYSICAL EXAM:  BP (!) 141/102   Pulse 100   Temp (!) 97 F (36.1 C) (Tympanic)   Resp 18   Ht 6' (1.829 m)   Wt 175 lb 14.4 oz (79.8 kg)   BMI 23.86 kg/m  Oral cavity is clear no oral mucosal lesions are identified.  Neck is clear without evidence of cervical or supraclavicular adenopathy.  Well-developed well-nourished patient in NAD. HEENT reveals PERLA, EOMI, discs not visualized.  Oral cavity is clear. No oral mucosal lesions are identified. Neck is clear without evidence of cervical or supraclavicular adenopathy. Lungs are clear to A&P. Cardiac examination is essentially unremarkable with regular rate and rhythm without murmur rub or thrill. Abdomen is benign with no organomegaly or masses noted. Motor sensory and DTR levels are equal and symmetric in the upper and lower extremities. Cranial nerves II through XII are grossly intact. Proprioception is intact. No peripheral adenopathy or edema is identified. No motor or sensory levels are noted. Crude visual fields are within normal range.  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  Of asked to see him back in 3 months for follow-up with a soft tissue scan of the head and neck prior to that visit.  Patient continues follow-up with ENT as well as medical oncology.  Patient knows to call with any concerns at any time.  I would like to take this opportunity to thank you for allowing me to participate in the care of your patient.Carmina Miller, MD

## 2023-07-14 ENCOUNTER — Inpatient Hospital Stay

## 2023-07-14 ENCOUNTER — Other Ambulatory Visit: Payer: Self-pay

## 2023-07-14 DIAGNOSIS — Z452 Encounter for adjustment and management of vascular access device: Secondary | ICD-10-CM | POA: Diagnosis not present

## 2023-07-14 DIAGNOSIS — C109 Malignant neoplasm of oropharynx, unspecified: Secondary | ICD-10-CM

## 2023-07-14 LAB — CBC WITH DIFFERENTIAL (CANCER CENTER ONLY)
Abs Immature Granulocytes: 0.02 10*3/uL (ref 0.00–0.07)
Basophils Absolute: 0 10*3/uL (ref 0.0–0.1)
Basophils Relative: 1 %
Eosinophils Absolute: 0 10*3/uL (ref 0.0–0.5)
Eosinophils Relative: 1 %
HCT: 29.4 % — ABNORMAL LOW (ref 39.0–52.0)
Hemoglobin: 10 g/dL — ABNORMAL LOW (ref 13.0–17.0)
Immature Granulocytes: 1 %
Lymphocytes Relative: 29 %
Lymphs Abs: 1.3 10*3/uL (ref 0.7–4.0)
MCH: 33.8 pg (ref 26.0–34.0)
MCHC: 34 g/dL (ref 30.0–36.0)
MCV: 99.3 fL (ref 80.0–100.0)
Monocytes Absolute: 0.8 10*3/uL (ref 0.1–1.0)
Monocytes Relative: 18 %
Neutro Abs: 2.3 10*3/uL (ref 1.7–7.7)
Neutrophils Relative %: 50 %
Platelet Count: 148 10*3/uL — ABNORMAL LOW (ref 150–400)
RBC: 2.96 MIL/uL — ABNORMAL LOW (ref 4.22–5.81)
RDW: 21 % — ABNORMAL HIGH (ref 11.5–15.5)
WBC Count: 4.4 10*3/uL (ref 4.0–10.5)
nRBC: 0 % (ref 0.0–0.2)

## 2023-07-14 LAB — BASIC METABOLIC PANEL - CANCER CENTER ONLY
Anion gap: 5 (ref 5–15)
BUN: 15 mg/dL (ref 8–23)
CO2: 27 mmol/L (ref 22–32)
Calcium: 8.5 mg/dL — ABNORMAL LOW (ref 8.9–10.3)
Chloride: 105 mmol/L (ref 98–111)
Creatinine: 0.76 mg/dL (ref 0.61–1.24)
GFR, Estimated: >60 mL/min (ref >60)
Glucose, Bld: 110 mg/dL — ABNORMAL HIGH (ref 70–99)
Potassium: 4.1 mmol/L (ref 3.5–5.1)
Sodium: 137 mmol/L (ref 135–145)

## 2023-07-14 MED ORDER — HEPARIN SOD (PORK) LOCK FLUSH 100 UNIT/ML IV SOLN
500.0000 [IU] | Freq: Once | INTRAVENOUS | Status: AC
  Administered 2023-07-14: 500 [IU] via INTRAVENOUS
  Filled 2023-07-14: qty 5

## 2023-07-14 NOTE — Progress Notes (Signed)
 Patient here for plus or minus fluids.  Potassium is 4.1.  No fluids needed per Elouise Munroe, NP.

## 2023-07-27 ENCOUNTER — Inpatient Hospital Stay (HOSPITAL_BASED_OUTPATIENT_CLINIC_OR_DEPARTMENT_OTHER): Payer: Medicare Other | Admitting: Oncology

## 2023-07-27 ENCOUNTER — Encounter: Payer: Self-pay | Admitting: Oncology

## 2023-07-27 ENCOUNTER — Inpatient Hospital Stay: Payer: Medicare Other | Attending: Oncology

## 2023-07-27 VITALS — BP 170/107 | HR 86 | Temp 97.1°F | Resp 19 | Ht 72.0 in | Wt 177.2 lb

## 2023-07-27 DIAGNOSIS — Z923 Personal history of irradiation: Secondary | ICD-10-CM | POA: Diagnosis not present

## 2023-07-27 DIAGNOSIS — Z79899 Other long term (current) drug therapy: Secondary | ICD-10-CM | POA: Diagnosis not present

## 2023-07-27 DIAGNOSIS — C109 Malignant neoplasm of oropharynx, unspecified: Secondary | ICD-10-CM | POA: Insufficient documentation

## 2023-07-27 DIAGNOSIS — Z87891 Personal history of nicotine dependence: Secondary | ICD-10-CM | POA: Insufficient documentation

## 2023-07-27 DIAGNOSIS — Z452 Encounter for adjustment and management of vascular access device: Secondary | ICD-10-CM | POA: Diagnosis present

## 2023-07-27 DIAGNOSIS — D6959 Other secondary thrombocytopenia: Secondary | ICD-10-CM | POA: Insufficient documentation

## 2023-07-27 DIAGNOSIS — T451X5A Adverse effect of antineoplastic and immunosuppressive drugs, initial encounter: Secondary | ICD-10-CM | POA: Insufficient documentation

## 2023-07-27 DIAGNOSIS — Z9221 Personal history of antineoplastic chemotherapy: Secondary | ICD-10-CM | POA: Diagnosis not present

## 2023-07-27 DIAGNOSIS — D6481 Anemia due to antineoplastic chemotherapy: Secondary | ICD-10-CM | POA: Diagnosis not present

## 2023-07-27 LAB — CMP (CANCER CENTER ONLY)
ALT: 20 U/L (ref 0–44)
AST: 22 U/L (ref 15–41)
Albumin: 4 g/dL (ref 3.5–5.0)
Alkaline Phosphatase: 53 U/L (ref 38–126)
Anion gap: 10 (ref 5–15)
BUN: 22 mg/dL (ref 8–23)
CO2: 23 mmol/L (ref 22–32)
Calcium: 8.5 mg/dL — ABNORMAL LOW (ref 8.9–10.3)
Chloride: 102 mmol/L (ref 98–111)
Creatinine: 0.83 mg/dL (ref 0.61–1.24)
GFR, Estimated: >60 mL/min (ref >60)
Glucose, Bld: 110 mg/dL — ABNORMAL HIGH (ref 70–99)
Potassium: 3.6 mmol/L (ref 3.5–5.1)
Sodium: 135 mmol/L (ref 135–145)
Total Bilirubin: 1 mg/dL (ref 0.0–1.2)
Total Protein: 6.8 g/dL (ref 6.5–8.1)

## 2023-07-27 LAB — CBC WITH DIFFERENTIAL (CANCER CENTER ONLY)
Abs Immature Granulocytes: 0.01 10*3/uL (ref 0.00–0.07)
Basophils Absolute: 0 10*3/uL (ref 0.0–0.1)
Basophils Relative: 0 %
Eosinophils Absolute: 0.1 10*3/uL (ref 0.0–0.5)
Eosinophils Relative: 1 %
HCT: 32.3 % — ABNORMAL LOW (ref 39.0–52.0)
Hemoglobin: 11.2 g/dL — ABNORMAL LOW (ref 13.0–17.0)
Immature Granulocytes: 0 %
Lymphocytes Relative: 26 %
Lymphs Abs: 1.2 10*3/uL (ref 0.7–4.0)
MCH: 34.9 pg — ABNORMAL HIGH (ref 26.0–34.0)
MCHC: 34.7 g/dL (ref 30.0–36.0)
MCV: 100.6 fL — ABNORMAL HIGH (ref 80.0–100.0)
Monocytes Absolute: 0.8 10*3/uL (ref 0.1–1.0)
Monocytes Relative: 18 %
Neutro Abs: 2.6 10*3/uL (ref 1.7–7.7)
Neutrophils Relative %: 55 %
Platelet Count: 148 10*3/uL — ABNORMAL LOW (ref 150–400)
RBC: 3.21 MIL/uL — ABNORMAL LOW (ref 4.22–5.81)
RDW: 17.8 % — ABNORMAL HIGH (ref 11.5–15.5)
WBC Count: 4.7 10*3/uL (ref 4.0–10.5)
nRBC: 0 % (ref 0.0–0.2)

## 2023-07-27 MED ORDER — SODIUM CHLORIDE 0.9% FLUSH
10.0000 mL | Freq: Once | INTRAVENOUS | Status: AC
  Administered 2023-07-27: 10 mL via INTRAVENOUS
  Filled 2023-07-27: qty 10

## 2023-07-27 MED ORDER — HEPARIN SOD (PORK) LOCK FLUSH 100 UNIT/ML IV SOLN
500.0000 [IU] | Freq: Once | INTRAVENOUS | Status: AC
  Administered 2023-07-27: 500 [IU] via INTRAVENOUS
  Filled 2023-07-27: qty 5

## 2023-07-27 NOTE — Progress Notes (Signed)
 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  Hypermetabolic left level 2 lymph nodes measuring up to 1.4 cm with an SUV of 5.6.  No additional areas of hypermetabolism.  CT Neck (02/15/2023) showed 3.4 x 2.3 x 2.7 cm enhancing mass centered in the left tonsillar fossa with possible extension to the left glossotonsillar sulcus and the left retromolar trigone and inferior extent into the left piriform sinus. There is a defect in the left posterior mandibular body, which may be related to recent tooth extraction, however it would be difficult to exclude the possibility of tumor involvement in this region. --Metastatic left level 2A conglomerate with additional suspicious left level 3 lymphadenopathy. Clustered, subcentimeter nodes in the left supraclavicular region are indeterminate but suspicious.      Patient underwent left tonsillar biopsy which was consistent with HPV positive squamous cell carcinoma.  Left glossotonsillar sulcus biopsy was also consistent with HPV positive SCC.  Patient was seen by Dr. Rosana Hoes from [facility] and he was recommended concurrent chemoradiation with  weekly cisplatin 40 mg/m.  There was possible concern for left supraclavicular lymph node involvement as per discussion in [facility] although it was not noted overtly on PET scan  Interval history-radiation mucositis is a lot better.  He is able to eat solid food including steak without significant pain or discomfort.  Does not report any alteration in his taste sensation.  ECOG PS- 0 Pain scale- 0   Review of systems- Review of Systems  Constitutional:  Negative for chills, fever, malaise/fatigue and weight loss.  HENT:  Negative for congestion, ear discharge and nosebleeds.   Eyes:  Negative for blurred vision.  Respiratory:  Negative for cough, hemoptysis, sputum production, shortness of breath and wheezing.   Cardiovascular:  Negative for chest pain, palpitations, orthopnea and claudication.  Gastrointestinal:  Negative for abdominal pain, blood in stool, constipation, diarrhea, heartburn, melena, nausea and vomiting.  Genitourinary:  Negative for dysuria, flank pain, frequency, hematuria and urgency.  Musculoskeletal:  Negative for back pain, joint pain and myalgias.  Skin:  Negative for rash.  Neurological:  Negative for dizziness, tingling, focal weakness, seizures, weakness and headaches.  Endo/Heme/Allergies:  Does not bruise/bleed easily.  Psychiatric/Behavioral:  Negative for depression and suicidal ideas. The patient does not have insomnia.       Allergies  Allergen Reactions   Citalopram Rash    Other reaction(s): "broke out" (he thinks, possibly from dye on generic?) prescribed by [facility] Jan 2013   Misc. Sulfonamide Containing Compounds     Other reaction(s): Do not use as he has been using crack cocaine   Other     Beta blockers     Past Medical History:  Diagnosis Date   Anxiety  Asthma    Cancer (HCC)    GERD (gastroesophageal reflux disease)    Hypertension    Throat cancer [facility])      Past Surgical History:  Procedure Laterality Date   IR IMAGING GUIDED  PORT INSERTION  04/08/2023    Social History   Socioeconomic History   Marital status: Single    Spouse name: Not on file   Number of children: Not on file   Years of education: Not on file   Highest education level: Not on file  Occupational History   Not on file  Tobacco Use   Smoking status: Former    Types: Cigarettes   Smokeless tobacco: Former  Substance and Sexual Activity   Alcohol use: Not Currently    Comment: Quit drinking 15-20 years ago.   Drug use: Not Currently    Types: "Crack" cocaine    Comment: Stop using being 15-20 yrs ago.   Sexual activity: Not Currently  Other Topics Concern   Not on file  Social History Narrative   Not on file   Social Drivers of Health   Financial Resource Strain: Low Risk  (05/21/2020)   [facility] from [facility]   Overall Financial Resource Strain (CARDIA)    Difficulty of Paying Living Expenses: Not very hard  Food Insecurity: No Food Insecurity (06/21/2023)   Hunger Vital Sign    Worried About Running Out of Food in the Last Year: Never true    Ran Out of Food in the Last Year: Never true  Transportation Needs: No Transportation Needs (06/21/2023)   PRAPARE - Administrator, Civil Service (Medical): No    Lack of Transportation (Non-Medical): No  Physical Activity: Inactive (05/04/2023)   Exercise Vital Sign    Days of Exercise per Week: 0 days    Minutes of Exercise per Session: 0 min  Stress: Not on file  Social Connections: Moderately Isolated (06/21/2023)   Social Connection and Isolation Panel [NHANES]    Frequency of Communication with Friends and Family: More than three times a week    Frequency of Social Gatherings with Friends and Family: Once a week    Attends Religious Services: More than 4 times per year    Active Member of [employer] or Organizations: No    Attends Banker Meetings: Never    Marital Status: Divorced  Catering manager Violence: Not At Risk (06/21/2023)    Humiliation, Afraid, Rape, and Kick questionnaire    Fear of Current or Ex-Partner: No    Emotionally Abused: No    Physically Abused: No    Sexually Abused: No    Family History  Problem Relation Age of Onset   Aneurysm Father    Cancer Sister    Lung cancer Paternal Aunt    Lung cancer Paternal Uncle    Pneumonia Paternal Grandfather      Current Outpatient Medications:    albuterol (VENTOLIN HFA) 108 (90 Base) MCG/ACT inhaler, , Disp: , Rfl:    aspirin 81 MG chewable tablet, , Disp: , Rfl:    benzocaine (ORAJEL) 10 % mucosal gel, Use as directed in the mouth or throat 3 (three) times daily., Disp: 5.3 g, Rfl: 0   feeding supplement (ENSURE ENLIVE / ENSURE PLUS) LIQD, Take 237 mLs by mouth 2 (two) times daily between meals., Disp: 14220 mL, Rfl: 0   magnesium chloride (SLOW-MAG) 64 MG TBEC SR tablet, Take 1 tablet (64 mg total) by mouth daily.,  Disp: 20 tablet, Rfl: 0   metFORMIN (GLUCOPHAGE-XR) 500 MG 24 hr tablet, , Disp: , Rfl:    nitroGLYCERIN (NITROSTAT) 0.4 MG SL tablet, Place under the tongue., Disp: , Rfl:    ondansetron (ZOFRAN) 8 MG tablet, Take 1 tablet (8 mg total) by mouth every 8 (eight) hours as needed for nausea or vomiting. Start on the third day after cisplatin., Disp: 30 tablet, Rfl: 1   pantoprazole (PROTONIX) 40 MG tablet, Take 1 tablet (40 mg total) by mouth daily., Disp: 30 tablet, Rfl: 0   potassium chloride SA (KLOR-CON M) 20 MEQ tablet, Take 1 tablet (20 mEq total) by mouth daily., Disp: 15 tablet, Rfl: 0   prochlorperazine (COMPAZINE) 10 MG tablet, Take 1 tablet (10 mg total) by mouth every 6 (six) hours as needed (Nausea or vomiting)., Disp: 30 tablet, Rfl: 1   sertraline (ZOLOFT) 25 MG tablet, , Disp: , Rfl:   Physical exam:  Vitals:   07/27/23 0852 07/27/23 0854  BP: (!) 163/104 (!) 170/107  Pulse: 86   Resp: 19   Temp: (!) 97.1 F (36.2 C)   TempSrc: Tympanic   SpO2: 100%   Weight: 177 lb 3.2 oz (80.4 kg)   Height: 6' (1.829 m)     Physical Exam Cardiovascular:     Rate and Rhythm: Normal rate and regular rhythm.     Heart sounds: Normal heart sounds.  Pulmonary:     Effort: Pulmonary effort is normal.     Breath sounds: Normal breath sounds.  Abdominal:     General: Bowel sounds are normal.     Palpations: Abdomen is soft.  Lymphadenopathy:     Comments: No palpable cervical adenopathy  Skin:    General: Skin is warm and dry.  Neurological:     Mental Status: He is alert and oriented to person, place, and time.      I have personally reviewed labs listed below:    Latest Ref Rng & Units 07/27/2023    8:39 AM  CMP  Glucose 70 - 99 mg/dL 102   BUN 8 - 23 mg/dL 22   Creatinine 7.25 - 1.24 mg/dL 3.66   Sodium 440 - 347 mmol/L 135   Potassium 3.5 - 5.1 mmol/L 3.6   Chloride 98 - 111 mmol/L 102   CO2 22 - 32 mmol/L 23   Calcium 8.9 - 10.3 mg/dL 8.5   Total Protein 6.5 - 8.1 g/dL 6.8   Total Bilirubin 0.0 - 1.2 mg/dL 1.0   Alkaline Phos 38 - 126 U/L 53   AST 15 - 41 U/L 22   ALT 0 - 44 U/L 20       Latest Ref Rng & Units 07/27/2023    8:39 AM  CBC  WBC 4.0 - 10.5 K/uL 4.7   Hemoglobin 13.0 - 17.0 g/dL 42.5   Hematocrit 95.6 - 52.0 % 32.3   Platelets 150 - 400 K/uL 148    Assessment and plan- Patient is a 68 y.o. male with history of stage I squamous cell carcinoma of the oropharynx T2 N1 M0.  He is s/p concurrent chemoradiation with weekly cisplatin and here for routine follow-up  Clinically patient is doing well with no concerning signs and symptoms of recurrence on today's exam.  Cervical adenopathy no longer palpable.  No evidence of radiation mucositis.  Patient completed radiation treatment towards the end of February 2025 and I will plan to get PET scan around the third week of May which would  be 12 weeks after his radiation and see him thereafter.  Chemo-induced anemia and thrombocytopenia have improved.  He still has a port in place and will get port labs when I see him to discuss PET scan  results.  If PET scan shows that he is in remission we will plan to get his port taken out at that time.  Patient is previously seeing [facility] for his diagnosis.  He wishes to keep his care local and not go back to [facility] for routine ENT exams.  We will refer him to [facility] for the same   Visit Diagnosis 1. Squamous cell carcinoma of oropharynx (HCC)   2. Chemotherapy-induced thrombocytopenia      Dr. Owens Shark, MD, MPH [facility] at [facility] [phone] 07/27/2023 9:28 AM

## 2023-07-29 ENCOUNTER — Telehealth: Payer: Self-pay | Admitting: *Deleted

## 2023-07-29 NOTE — Telephone Encounter (Signed)
 Carl Waters wanted to get paper approved for the home health he is getting. She is send it in 2 times but the fax number was not correct . I told her to send it to [phone]. She will fax it to the number above

## 2023-08-12 ENCOUNTER — Telehealth: Payer: Self-pay

## 2023-08-12 NOTE — Telephone Encounter (Signed)
 Outbound call to [facility] to follow up on referral faxed over 07/27/23.  Spoke to [place] who confirmed that the physician referred the patient to [facility] instead.

## 2023-09-13 ENCOUNTER — Ambulatory Visit
Admission: RE | Admit: 2023-09-13 | Discharge: 2023-09-13 | Disposition: A | Discharge status: Home / Self Care | Source: Ambulatory Visit | Attending: Oncology | Admitting: Oncology

## 2023-09-13 DIAGNOSIS — K5730 Diverticulosis of large intestine without perforation or abscess without bleeding: Secondary | ICD-10-CM | POA: Insufficient documentation

## 2023-09-13 DIAGNOSIS — I2510 Atherosclerotic heart disease of native coronary artery without angina pectoris: Secondary | ICD-10-CM | POA: Insufficient documentation

## 2023-09-13 DIAGNOSIS — R930 Abnormal findings on diagnostic imaging of skull and head, not elsewhere classified: Secondary | ICD-10-CM | POA: Diagnosis not present

## 2023-09-13 DIAGNOSIS — M65911 Unspecified synovitis and tenosynovitis, right shoulder: Secondary | ICD-10-CM | POA: Diagnosis not present

## 2023-09-13 DIAGNOSIS — M659 Unspecified synovitis and tenosynovitis, unspecified site: Secondary | ICD-10-CM | POA: Diagnosis not present

## 2023-09-13 DIAGNOSIS — I700 Atherosclerosis of aorta: Secondary | ICD-10-CM | POA: Insufficient documentation

## 2023-09-13 DIAGNOSIS — C109 Malignant neoplasm of oropharynx, unspecified: Secondary | ICD-10-CM | POA: Insufficient documentation

## 2023-09-13 DIAGNOSIS — N400 Enlarged prostate without lower urinary tract symptoms: Secondary | ICD-10-CM | POA: Diagnosis not present

## 2023-09-13 LAB — GLUCOSE, CAPILLARY: Glucose-Capillary: 94 mg/dL (ref 70–99)

## 2023-09-13 MED ORDER — FLUDEOXYGLUCOSE F - 18 (FDG) INJECTION
9.2000 | Freq: Once | INTRAVENOUS | Status: AC | PRN
  Administered 2023-09-13: 9.66 via INTRAVENOUS

## 2023-09-16 ENCOUNTER — Inpatient Hospital Stay: Attending: Oncology

## 2023-09-16 DIAGNOSIS — Z452 Encounter for adjustment and management of vascular access device: Secondary | ICD-10-CM | POA: Diagnosis present

## 2023-09-16 DIAGNOSIS — C109 Malignant neoplasm of oropharynx, unspecified: Secondary | ICD-10-CM | POA: Insufficient documentation

## 2023-09-16 DIAGNOSIS — D6959 Other secondary thrombocytopenia: Secondary | ICD-10-CM

## 2023-09-16 DIAGNOSIS — Z95828 Presence of other vascular implants and grafts: Secondary | ICD-10-CM

## 2023-09-16 LAB — CBC WITH DIFFERENTIAL (CANCER CENTER ONLY)
Abs Immature Granulocytes: 0.02 10*3/uL (ref 0.00–0.07)
Basophils Absolute: 0 10*3/uL (ref 0.0–0.1)
Basophils Relative: 0 %
Eosinophils Absolute: 0.1 10*3/uL (ref 0.0–0.5)
Eosinophils Relative: 3 %
HCT: 37.2 % — ABNORMAL LOW (ref 39.0–52.0)
Hemoglobin: 13 g/dL (ref 13.0–17.0)
Immature Granulocytes: 0 %
Lymphocytes Relative: 24 %
Lymphs Abs: 1.1 10*3/uL (ref 0.7–4.0)
MCH: 33.5 pg (ref 26.0–34.0)
MCHC: 34.9 g/dL (ref 30.0–36.0)
MCV: 95.9 fL (ref 80.0–100.0)
Monocytes Absolute: 0.6 10*3/uL (ref 0.1–1.0)
Monocytes Relative: 14 %
Neutro Abs: 2.7 10*3/uL (ref 1.7–7.7)
Neutrophils Relative %: 59 %
Platelet Count: 145 10*3/uL — ABNORMAL LOW (ref 150–400)
RBC: 3.88 MIL/uL — ABNORMAL LOW (ref 4.22–5.81)
RDW: 11.4 % — ABNORMAL LOW (ref 11.5–15.5)
WBC Count: 4.6 10*3/uL (ref 4.0–10.5)
nRBC: 0 % (ref 0.0–0.2)

## 2023-09-16 LAB — CMP (CANCER CENTER ONLY)
ALT: 16 U/L (ref 0–44)
AST: 19 U/L (ref 15–41)
Albumin: 3.9 g/dL (ref 3.5–5.0)
Alkaline Phosphatase: 59 U/L (ref 38–126)
Anion gap: 7 (ref 5–15)
BUN: 19 mg/dL (ref 8–23)
CO2: 26 mmol/L (ref 22–32)
Calcium: 8.5 mg/dL — ABNORMAL LOW (ref 8.9–10.3)
Chloride: 106 mmol/L (ref 98–111)
Creatinine: 0.88 mg/dL (ref 0.61–1.24)
GFR, Estimated: >60 mL/min (ref >60)
Glucose, Bld: 108 mg/dL — ABNORMAL HIGH (ref 70–99)
Potassium: 4 mmol/L (ref 3.5–5.1)
Sodium: 139 mmol/L (ref 135–145)
Total Bilirubin: 0.6 mg/dL (ref 0.0–1.2)
Total Protein: 7 g/dL (ref 6.5–8.1)

## 2023-09-16 MED ORDER — HEPARIN SOD (PORK) LOCK FLUSH 100 UNIT/ML IV SOLN
500.0000 [IU] | Freq: Once | INTRAVENOUS | Status: AC
  Administered 2023-09-16: 500 [IU] via INTRAVENOUS
  Filled 2023-09-16: qty 5

## 2023-09-16 MED ORDER — SODIUM CHLORIDE 0.9% FLUSH
10.0000 mL | Freq: Once | INTRAVENOUS | Status: AC
  Administered 2023-09-16: 10 mL via INTRAVENOUS
  Filled 2023-09-16: qty 10

## 2023-09-28 ENCOUNTER — Inpatient Hospital Stay: Attending: Oncology

## 2023-09-28 ENCOUNTER — Inpatient Hospital Stay (HOSPITAL_BASED_OUTPATIENT_CLINIC_OR_DEPARTMENT_OTHER): Admitting: Oncology

## 2023-09-28 ENCOUNTER — Encounter: Payer: Self-pay | Admitting: Oncology

## 2023-09-28 ENCOUNTER — Telehealth: Payer: Self-pay

## 2023-09-28 VITALS — BP 91/60 | HR 96 | Temp 97.8°F | Resp 18 | Wt 177.0 lb

## 2023-09-28 DIAGNOSIS — Z452 Encounter for adjustment and management of vascular access device: Secondary | ICD-10-CM | POA: Insufficient documentation

## 2023-09-28 DIAGNOSIS — C109 Malignant neoplasm of oropharynx, unspecified: Secondary | ICD-10-CM | POA: Diagnosis present

## 2023-09-28 DIAGNOSIS — Z08 Encounter for follow-up examination after completed treatment for malignant neoplasm: Secondary | ICD-10-CM

## 2023-09-28 DIAGNOSIS — Z8589 Personal history of malignant neoplasm of other organs and systems: Secondary | ICD-10-CM

## 2023-09-28 LAB — CBC WITH DIFFERENTIAL (CANCER CENTER ONLY)
Abs Immature Granulocytes: 0.02 10*3/uL (ref 0.00–0.07)
Basophils Absolute: 0 10*3/uL (ref 0.0–0.1)
Basophils Relative: 1 %
Eosinophils Absolute: 0.2 10*3/uL (ref 0.0–0.5)
Eosinophils Relative: 4 %
HCT: 39.6 % (ref 39.0–52.0)
Hemoglobin: 13.7 g/dL (ref 13.0–17.0)
Immature Granulocytes: 0 %
Lymphocytes Relative: 24 %
Lymphs Abs: 1.3 10*3/uL (ref 0.7–4.0)
MCH: 33 pg (ref 26.0–34.0)
MCHC: 34.6 g/dL (ref 30.0–36.0)
MCV: 95.4 fL (ref 80.0–100.0)
Monocytes Absolute: 0.7 10*3/uL (ref 0.1–1.0)
Monocytes Relative: 12 %
Neutro Abs: 3.2 10*3/uL (ref 1.7–7.7)
Neutrophils Relative %: 59 %
Platelet Count: 158 10*3/uL (ref 150–400)
RBC: 4.15 MIL/uL — ABNORMAL LOW (ref 4.22–5.81)
RDW: 11.6 % (ref 11.5–15.5)
WBC Count: 5.5 10*3/uL (ref 4.0–10.5)
nRBC: 0 % (ref 0.0–0.2)

## 2023-09-28 LAB — CMP (CANCER CENTER ONLY)
ALT: 14 U/L (ref 0–44)
AST: 20 U/L (ref 15–41)
Albumin: 3.9 g/dL (ref 3.5–5.0)
Alkaline Phosphatase: 60 U/L (ref 38–126)
Anion gap: 10 (ref 5–15)
BUN: 21 mg/dL (ref 8–23)
CO2: 23 mmol/L (ref 22–32)
Calcium: 8.4 mg/dL — ABNORMAL LOW (ref 8.9–10.3)
Chloride: 106 mmol/L (ref 98–111)
Creatinine: 0.88 mg/dL (ref 0.61–1.24)
GFR, Estimated: >60 mL/min (ref >60)
Glucose, Bld: 190 mg/dL — ABNORMAL HIGH (ref 70–99)
Potassium: 3.5 mmol/L (ref 3.5–5.1)
Sodium: 139 mmol/L (ref 135–145)
Total Bilirubin: 0.5 mg/dL (ref 0.0–1.2)
Total Protein: 6.9 g/dL (ref 6.5–8.1)

## 2023-09-28 NOTE — Progress Notes (Addendum)
 Outbound call to [facility] [phone], spoke to [facility] who provided the telephone number to Dr. Katherleen Pancoast office [phone].  Outbound call to number provided; spoke to [place] who indicated they do not do the scheduling for Dr. Ndon in head and neck.  Was transferred to [phone]; Maribeth Shivers informed if any documents need to be faxed back to send to [phone].  Spoke to [place] and informed that [facility] would prefer to he continues to follow-up with [facility] and patient will need to re-establish care with [facility] to reestablish follow-up with them. Jessica scheduled patient for appointment on Fri 10/08/23 and sent him a my chart message to review.

## 2023-09-28 NOTE — Progress Notes (Signed)
 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  Hypermetabolic left level 2 lymph nodes measuring up to 1.4 cm with an SUV of 5.6.  No additional areas of hypermetabolism.  CT Neck (02/15/2023) showed 3.4 x 2.3 x 2.7 cm enhancing mass centered in the left tonsillar fossa with possible extension to the left glossotonsillar sulcus and the left retromolar trigone and inferior extent into the left piriform sinus. There is a defect in the left posterior mandibular body, which may be related to recent tooth extraction, however it would be difficult to exclude the possibility of tumor involvement in this region. --Metastatic left level 2A conglomerate with additional suspicious left level 3 lymphadenopathy. Clustered, subcentimeter nodes in the left supraclavicular region are indeterminate but suspicious.      Patient underwent left tonsillar biopsy which was consistent with HPV positive squamous cell carcinoma.  Left glossotonsillar sulcus biopsy was also consistent with HPV positive SCC.  Patient was seen by Dr. Doyne Genin from [facility] and he was recommended concurrent chemoradiation with weekly cisplatin  40 mg/m.  There was  possible concern for left supraclavicular lymph node involvement as per discussion in [facility] although it was not noted overtly on PET scan.  Patient completed concurrent chemoradiation with weekly cisplatin  in February 2025  Interval history-he feels well overall.  Taste sensation is gradually coming back and appetite has improved.    ECOG PS- 1 Pain scale- 0   Review of systems- Review of Systems  Constitutional:  Negative for chills, fever, malaise/fatigue and weight loss.  HENT:  Negative for congestion, ear discharge and nosebleeds.   Eyes:  Negative for blurred vision.  Respiratory:  Negative for cough, hemoptysis, sputum production, shortness of breath and wheezing.   Cardiovascular:  Negative for chest pain, palpitations, orthopnea and claudication.  Gastrointestinal:  Negative for abdominal pain, blood in stool, constipation, diarrhea, heartburn, melena, nausea and vomiting.  Genitourinary:  Negative for dysuria, flank pain, frequency, hematuria and urgency.  Musculoskeletal:  Negative for back pain, joint pain and myalgias.  Skin:  Negative for rash.  Neurological:  Negative for dizziness, tingling, focal weakness, seizures, weakness and headaches.  Endo/Heme/Allergies:  Does not bruise/bleed easily.  Psychiatric/Behavioral:  Negative for depression and suicidal ideas. The patient does not have insomnia.       Allergies  Allergen Reactions   Citalopram Rash    Other reaction(s): "broke out" (he thinks, possibly from dye on generic?) prescribed by [facility] Jan 2013   Misc. Sulfonamide Containing Compounds     Other reaction(s): Do not use as he has been using crack cocaine   Other     Beta blockers     Past Medical History:  Diagnosis Date   Anxiety    Asthma  Cancer (HCC)    GERD (gastroesophageal reflux disease)    Hypertension    Throat cancer [facility])      Past Surgical History:  Procedure Laterality Date   IR IMAGING GUIDED PORT INSERTION  04/08/2023     Social History   Socioeconomic History   Marital status: Single    Spouse name: Not on file   Number of children: Not on file   Years of education: Not on file   Highest education level: Not on file  Occupational History   Not on file  Tobacco Use   Smoking status: Former    Types: Cigarettes   Smokeless tobacco: Former  Substance and Sexual Activity   Alcohol use: Not Currently    Comment: Quit drinking 15-20 years ago.   Drug use: Not Currently    Types: "Crack" cocaine    Comment: Stop using being 15-20 yrs ago.   Sexual activity: Not Currently  Other Topics Concern   Not on file  Social History Narrative   Not on file   Social Drivers of Health   Financial Resource Strain: Low Risk  (05/21/2020)   Received from [facility]   Overall Financial Resource Strain (CARDIA)    Difficulty of Paying Living Expenses: Not very hard  Food Insecurity: No Food Insecurity (06/21/2023)   Hunger Vital Sign    Worried About Running Out of Food in the Last Year: Never true    Ran Out of Food in the Last Year: Never true  Transportation Needs: No Transportation Needs (06/21/2023)   PRAPARE - Administrator, Civil Service (Medical): No    Lack of Transportation (Non-Medical): No  Physical Activity: Inactive (05/04/2023)   Exercise Vital Sign    Days of Exercise per Week: 0 days    Minutes of Exercise per Session: 0 min  Stress: Not on file  Social Connections: Moderately Isolated (06/21/2023)   Social Connection and Isolation Panel [NHANES]    Frequency of Communication with Friends and Family: More than three times a week    Frequency of Social Gatherings with Friends and Family: Once a week    Attends Religious Services: More than 4 times per year    Active Member of [employer] or Organizations: No    Attends Banker Meetings: Never    Marital Status: Divorced  Catering manager Violence: Not At Risk (06/21/2023)   Humiliation, Afraid,  Rape, and Kick questionnaire    Fear of Current or Ex-Partner: No    Emotionally Abused: No    Physically Abused: No    Sexually Abused: No    Family History  Problem Relation Age of Onset   Aneurysm Father    Cancer Sister    Lung cancer Paternal Aunt    Lung cancer Paternal Uncle    Pneumonia Paternal Grandfather      Current Outpatient Medications:    albuterol (VENTOLIN HFA) 108 (90 Base) MCG/ACT inhaler, , Disp: , Rfl:    aspirin  81 MG chewable tablet, , Disp: , Rfl:    benzocaine  (ORAJEL) 10 % mucosal gel, Use as directed in the mouth or throat 3 (three) times daily., Disp: 5.3 g, Rfl: 0   feeding supplement (ENSURE ENLIVE / ENSURE PLUS) LIQD, Take 237 mLs by mouth 2 (two) times daily between meals., Disp: 14220 mL, Rfl: 0   magnesium  chloride (SLOW-MAG) 64 MG TBEC SR tablet, Take 1 tablet (64 mg total) by mouth daily., Disp: 20 tablet, Rfl:  0   metFORMIN (GLUCOPHAGE-XR) 500 MG 24 hr tablet, , Disp: , Rfl:    nitroGLYCERIN (NITROSTAT) 0.4 MG SL tablet, Place under the tongue., Disp: , Rfl:    ondansetron  (ZOFRAN ) 8 MG tablet, Take 1 tablet (8 mg total) by mouth every 8 (eight) hours as needed for nausea or vomiting. Start on the third day after cisplatin ., Disp: 30 tablet, Rfl: 1   pantoprazole  (PROTONIX ) 40 MG tablet, Take 1 tablet (40 mg total) by mouth daily., Disp: 30 tablet, Rfl: 0   potassium chloride  SA (KLOR-CON  M) 20 MEQ tablet, Take 1 tablet (20 mEq total) by mouth daily., Disp: 15 tablet, Rfl: 0   prochlorperazine  (COMPAZINE ) 10 MG tablet, Take 1 tablet (10 mg total) by mouth every 6 (six) hours as needed (Nausea or vomiting)., Disp: 30 tablet, Rfl: 1   sertraline (ZOLOFT) 25 MG tablet, , Disp: , Rfl:   Physical exam:  Vitals:   09/28/23 0907  BP: 91/60  Pulse: 96  Resp: 18  Temp: 97.8 F (36.6 C)  TempSrc: Tympanic  SpO2: 97%  Weight: 177 lb (80.3 kg)   Physical Exam HENT:     Mouth/Throat:     Mouth: Mucous membranes are moist.     Pharynx: Oropharynx  is clear.  Neck:     Comments: Radiation changes noted over left neck. No palpable adenopathy Cardiovascular:     Rate and Rhythm: Normal rate and regular rhythm.     Heart sounds: Normal heart sounds.  Pulmonary:     Effort: Pulmonary effort is normal.     Breath sounds: Normal breath sounds.  Abdominal:     General: Bowel sounds are normal.     Palpations: Abdomen is soft.  Skin:    General: Skin is warm and dry.  Neurological:     Mental Status: He is alert and oriented to person, place, and time.      I have personally reviewed labs listed below:    Latest Ref Rng & Units 09/28/2023    8:53 AM  CMP  Glucose 70 - 99 mg/dL 161   BUN 8 - 23 mg/dL 21   Creatinine 0.96 - 1.24 mg/dL 0.45   Sodium 409 - 811 mmol/L 139   Potassium 3.5 - 5.1 mmol/L 3.5   Chloride 98 - 111 mmol/L 106   CO2 22 - 32 mmol/L 23   Calcium 8.9 - 10.3 mg/dL 8.4   Total Protein 6.5 - 8.1 g/dL 6.9   Total Bilirubin 0.0 - 1.2 mg/dL 0.5   Alkaline Phos 38 - 126 U/L 60   AST 15 - 41 U/L 20   ALT 0 - 44 U/L 14       Latest Ref Rng & Units 09/28/2023    8:53 AM  CBC  WBC 4.0 - 10.5 K/uL 5.5   Hemoglobin 13.0 - 17.0 g/dL 91.4   Hematocrit 78.2 - 52.0 % 39.6   Platelets 150 - 400 K/uL 158    I have personally reviewed Radiology images listed below: No images are attached to the encounter.  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 EXAM: NUCLEAR MEDICINE PET SKULL BASE TO THIGH TECHNIQUE: 9.7 mCi F-18 FDG was injected intravenously. Full-ring PET imaging was performed from the skull base to thigh after the radiotracer. CT data was obtained and used for attenuation correction and anatomic localization. Fasting blood glucose: 94 mg/dl COMPARISON:  95/62/1308  FINDINGS: Mediastinal blood pool activity: SUV max 2.5 Liver activity: SUV max NA NECK: The left tonsillar/oropharyngeal mass is no longer  readily apparent nor is there substantial asymmetric activity along the left air oropharynx. Maximum SUV in this vicinity currently 3.0 and formerly 11.7. The previous hypermetabolic left level IIa lymph node has resolved, no significant residual activity in the vicinity of the prior lymph node. No new hypermetabolic adenopathy in the region. No new hypermetabolic lesion. Incidental CT findings: Continued complete opacification of the left maxillary sinus which may represent acute or chronic sinusitis. No adjacent bony destructive findings or significant surrounding soft tissue abnormality observed. CHEST: No significant abnormal hypermetabolic activity in this region. Incidental CT findings: Elevated left hemidiaphragm. Right Port-A-Cath tip: Right atrium. Atheromatous vascular calcification of the aortic arch and left anterior descending coronary artery. Mild atelectasis in the left lower lobe. [place] projects over the thoracoabdominal wall soft tissues, mostly subcutaneous and intramuscular, and to a lesser extent in the left arm. ABDOMEN/PELVIS: No significant abnormal hypermetabolic activity in this region. Incidental CT findings: Atherosclerosis is present, including aortoiliac atherosclerotic disease. Mild sigmoid colon diverticulosis. Prostatomegaly. SKELETON: High activity associated with chronic synovitis in the right glenohumeral joint. Right humeral head prosthesis and resurfacing/prosthesis of the right glenoid. No skeletal findings suspicious for active malignancy. Incidental CT findings: Degenerative hip arthropathy bilaterally. IMPRESSION: 1. The left tonsillar/oropharyngeal mass has resolved. 2. The previous hypermetabolic left level IIA lymph node has resolved, no significant residual activity in the vicinity of the prior lymph node. 3. No new hypermetabolic lesion or findings of metastatic disease. 4. Continued complete opacification of the left maxillary sinus which may represent acute or  chronic sinusitis. 5. Prostatomegaly. 6. 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 7. Degenerative hip arthropathy bilaterally. 8.  Aortic Atherosclerosis (ICD10-I70.0).  Coronary atherosclerosis. Electronically Signed   By: Freida Jes M.D.   On: 09/14/2023 15:29     Assessment and plan- Patient is a 68 y.o. male with history of stage I squamous cell carcinoma of the oropharynx T2 N1 M0 s/p concurrent chemoradiation here to discuss PET scan results and further management  I have reviewed PET CT scan images independently and discussed findings with the patient which does not show any evidence of recurrent or progressive disease.  After completing concurrent chemoradiation the left tonsillar oropharyngeal masses no longer apparent an SUV in this facility was 3 as compared to 11.7 before.  Hypermetabolism in level 2A lymph node has also resolved.  Moving forward patient does not require any surveillance scans.  He will need to follow-up however with ENT for NPL exams.  I did discuss his case with [facility] and they would prefer to he continues to follow-up with [facility] for this and they will reach out to [facility] to reestablish follow-up with him  I will see him back in 4 months no labs.  We will plan to get his port taken out at this time   Visit Diagnosis 1. Encounter for follow-up surveillance of head and neck cancer      Dr. Seretha Dance, MD, MPH [facility] [phone] 09/28/2023 9:32 AM

## 2023-09-28 NOTE — Progress Notes (Signed)
 Referral sent to IR for port removal; placed by Dr. Nereida Banning  on 04/08/2023.

## 2023-09-28 NOTE — Telephone Encounter (Signed)
 Created in error. Please disregard.

## 2023-09-28 NOTE — Progress Notes (Signed)
 Outbound call to patient to inform he's been scheduled for Head and Neck at [facility] for 10/08/23.  Patient asked what time; advised to check his messages from [facility] and reminded him it's not this Friday but next Friday.  Patient verbalized understanding.

## 2023-09-28 NOTE — Progress Notes (Signed)
 Patient had a CT scan on PET scan.  He says that his appetite is better and he is doing better.

## 2023-09-29 ENCOUNTER — Other Ambulatory Visit: Payer: Self-pay

## 2023-10-08 ENCOUNTER — Encounter: Payer: Self-pay | Admitting: Oncology
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
## 2023-10-11 ENCOUNTER — Ambulatory Visit: Payer: Self-pay | Admitting: Internal Medicine

## 2023-10-12 ENCOUNTER — Ambulatory Visit

## 2023-10-25 ENCOUNTER — Encounter: Payer: Self-pay | Admitting: Radiation Oncology

## 2023-10-25 ENCOUNTER — Ambulatory Visit
Admission: RE | Admit: 2023-10-25 | Discharge: 2023-10-25 | Disposition: A | Discharge status: Home / Self Care | Source: Ambulatory Visit | Attending: Radiation Oncology | Admitting: Radiation Oncology

## 2023-10-25 VITALS — BP 123/88 | HR 86 | Temp 97.8°F | Resp 16 | Ht 72.0 in | Wt 175.0 lb

## 2023-10-25 DIAGNOSIS — Z923 Personal history of irradiation: Secondary | ICD-10-CM | POA: Diagnosis not present

## 2023-10-25 DIAGNOSIS — Z9221 Personal history of antineoplastic chemotherapy: Secondary | ICD-10-CM | POA: Diagnosis not present

## 2023-10-25 DIAGNOSIS — Z85818 Personal history of malignant neoplasm of other sites of lip, oral cavity, and pharynx: Secondary | ICD-10-CM | POA: Diagnosis present

## 2023-10-25 DIAGNOSIS — C109 Malignant neoplasm of oropharynx, unspecified: Secondary | ICD-10-CM

## 2023-10-25 NOTE — Progress Notes (Signed)
 Radiation Oncology Follow up Note  Name: Carl Waters   Date:   10/25/2023 MRN:  [id number] DOB: Apr 07, 1956    This 68 y.o. 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  Seen today in routine follow-up he is doing well.  Specifically denies any head or neck pain or dysphagia.  Taste is returning to normal.  He is currently undergoing monthly examinations by ENT showing no evidence of disease..  Last month he had a PET CT scan showing tonsillar mass of the left completely resolved and hypermetabolic left full 2A lymph nodes on the left also resolved.  No new hypermetabolic lesions or finding metastatic disease is noted.  He does have left maxillary sinus opacification.  COMPLICATIONS OF TREATMENT: none  FOLLOW UP COMPLIANCE: keeps appointments   PHYSICAL EXAM:  BP 123/88   Pulse 86   Temp 97.8 F (36.6 C) (Tympanic)   Resp 16   Ht 6' (1.829 m)   Wt 175 lb (79.4 kg)   BMI 23.73 kg/m  Oral cavity is clear no oral mucosal lesions are identified left tonsil region appears unremarkable.  Neck is clear without evidence of adenopathy.  Well-developed well-nourished patient in NAD. HEENT reveals PERLA, EOMI, discs not visualized.  Oral cavity is clear. No oral mucosal lesions are identified. Neck is clear without evidence of cervical or supraclavicular adenopathy. Lungs are clear to A&P. Cardiac examination is essentially unremarkable with regular rate and rhythm without murmur rub or thrill. Abdomen is benign with no organomegaly or masses noted. Motor sensory and DTR levels are equal and symmetric in the upper and lower extremities. Cranial  nerves II through XII are grossly intact. Proprioception is intact. No peripheral adenopathy or edema is identified. No motor or sensory levels are noted. Crude visual fields are within normal range.  RADIOLOGY RESULTS: PET CT scan reviewed compatible with above-stated findings  PLAN: The present time patient is doing well with no evidence of disease now out 4 months after concurrent chemoradiation therapy for tonsillar cancer.  And pleased with his overall progress.  His side effect profile is low.  I asked to see him back in 6 months for follow-up.  He continues monthly examinations by ENT at [facility].  Patient knows to call with any concerns at any time.  I would like to take this opportunity to thank you for allowing me to participate in the care of your patient.SABRA Marcey Penton, MD

## 2023-10-26 ENCOUNTER — Other Ambulatory Visit: Payer: Self-pay

## 2023-12-27 ENCOUNTER — Encounter: Payer: Self-pay | Admitting: Oncology

## 2023-12-28 ENCOUNTER — Other Ambulatory Visit: Payer: Self-pay | Admitting: Physician Assistant

## 2023-12-28 DIAGNOSIS — M25511 Pain in right shoulder: Secondary | ICD-10-CM

## 2023-12-31 ENCOUNTER — Encounter: Payer: Self-pay | Admitting: Oncology

## 2024-01-07 ENCOUNTER — Ambulatory Visit
Admission: RE | Admit: 2024-01-07 | Discharge: 2024-01-07 | Disposition: A | Discharge status: Home / Self Care | Source: Ambulatory Visit | Attending: Physician Assistant | Admitting: Physician Assistant

## 2024-01-07 DIAGNOSIS — M25511 Pain in right shoulder: Secondary | ICD-10-CM

## 2024-01-07 MED ORDER — SODIUM CHLORIDE (PF) 0.9% IJ SOLUTION - NO CHARGE
5.0000 mL | Freq: Once | INTRAMUSCULAR | Status: AC
Start: 2024-01-07 — End: 2024-01-07
  Administered 2024-01-07: 5 mL via INTRAVENOUS
  Filled 2024-01-07: qty 6

## 2024-01-07 MED ORDER — IOHEXOL 180 MG/ML  SOLN
15.0000 mL | Freq: Once | INTRAMUSCULAR | Status: AC | PRN
  Administered 2024-01-07: 15 mL

## 2024-01-26 ENCOUNTER — Inpatient Hospital Stay: Attending: Oncology | Admitting: Oncology

## 2024-01-26 ENCOUNTER — Encounter: Payer: Self-pay | Admitting: Oncology

## 2024-01-26 VITALS — BP 121/87 | HR 83 | Temp 96.4°F | Resp 18 | Ht 72.0 in | Wt 175.0 lb

## 2024-01-26 DIAGNOSIS — Z95828 Presence of other vascular implants and grafts: Secondary | ICD-10-CM | POA: Diagnosis not present

## 2024-01-26 DIAGNOSIS — Z85818 Personal history of malignant neoplasm of other sites of lip, oral cavity, and pharynx: Secondary | ICD-10-CM | POA: Diagnosis present

## 2024-01-26 DIAGNOSIS — Z87891 Personal history of nicotine dependence: Secondary | ICD-10-CM | POA: Insufficient documentation

## 2024-01-26 DIAGNOSIS — Z8589 Personal history of malignant neoplasm of other organs and systems: Secondary | ICD-10-CM | POA: Diagnosis not present

## 2024-01-26 DIAGNOSIS — Z08 Encounter for follow-up examination after completed treatment for malignant neoplasm: Secondary | ICD-10-CM | POA: Diagnosis not present

## 2024-01-26 DIAGNOSIS — Z923 Personal history of irradiation: Secondary | ICD-10-CM | POA: Diagnosis not present

## 2024-01-26 DIAGNOSIS — Z9221 Personal history of antineoplastic chemotherapy: Secondary | ICD-10-CM | POA: Insufficient documentation

## 2024-01-26 NOTE — Progress Notes (Signed)
 Patient states he's doing good. He is working through a shoulder injury at the moment. Otherwise, no new or acute concerns.

## 2024-01-26 NOTE — Progress Notes (Signed)
 Survivorship Care Plan visit completed.  Treatment summary reviewed and given to patient.  ASCO answers booklet reviewed and given to patient.  CARE program and Cancer Transitions discussed with patient along with other resources cancer center offers to patients and caregivers.  Patient verbalized understanding.

## 2024-01-26 NOTE — Addendum Note (Signed)
 Addended by: Leeya Rusconi on: 01/26/2024 10:29 AM   Modules accepted: Orders
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
## 2024-01-27 ENCOUNTER — Telehealth: Payer: Self-pay

## 2024-01-27 NOTE — Telephone Encounter (Signed)
 Regarding port removal per Clarita He could come in tomorrow afternoon if he wanted to. This is done with local anes so he wouldn't have to be NPO. He could drive himself. It doesn't take but about 45 min, if that long. He would come to the Heart & Vascular entrance and they take him straight back to the IR Lab and then right back out the door. I have a 3:15p an arrive at 3p open tomorrow. If not, I can look for something else tomorrow. Voice message left.

## 2024-01-28 ENCOUNTER — Other Ambulatory Visit: Payer: Self-pay

## 2024-01-28 ENCOUNTER — Ambulatory Visit
Admission: RE | Admit: 2024-01-28 | Discharge: 2024-01-28 | Disposition: A | Discharge status: Home / Self Care | Source: Ambulatory Visit | Attending: Oncology | Admitting: Oncology

## 2024-01-28 ENCOUNTER — Encounter: Payer: Self-pay | Admitting: Oncology

## 2024-01-28 DIAGNOSIS — Z452 Encounter for adjustment and management of vascular access device: Secondary | ICD-10-CM | POA: Insufficient documentation

## 2024-01-28 DIAGNOSIS — Z95828 Presence of other vascular implants and grafts: Secondary | ICD-10-CM

## 2024-01-28 HISTORY — PX: IR REMOVAL TUN ACCESS W/ PORT W/O FL MOD SED: IMG2290

## 2024-01-28 MED ORDER — LIDOCAINE HCL 1 % IJ SOLN
INTRAMUSCULAR | Status: AC
  Filled 2024-01-28: qty 20

## 2024-01-28 MED ORDER — LIDOCAINE HCL 1 % IJ SOLN: 10.0000 mL | Freq: Once | INTRAMUSCULAR | Status: DC

## 2024-01-28 NOTE — Procedures (Signed)
 Interventional Radiology Procedure Note  Procedure: Port removal   Complications: None  Estimated Blood Loss: < 10 mL  Findings: RIJ port removed.  Cordella DELENA Banner, MD

## 2024-01-28 NOTE — Telephone Encounter (Signed)
 Outbound call to patient; reviewed information below.  Patient agreed to port removal today 10/3 appointment time 3:15pm with arrival time 3pm.

## 2024-02-25 ENCOUNTER — Encounter: Payer: Self-pay | Admitting: Oncology

## 2024-03-10 ENCOUNTER — Other Ambulatory Visit: Payer: Self-pay

## 2024-04-17 NOTE — Telephone Encounter (Signed)
 Pt called back, I informed him of appt changed. He expressed understanding.  Thank you,  Jeoffrey LITTIE Presser [facility] [phone]

## 2024-05-03 ENCOUNTER — Ambulatory Visit
Admission: RE | Admit: 2024-05-03 | Discharge: 2024-05-03 | Disposition: A | Discharge status: Home / Self Care | Source: Ambulatory Visit | Attending: Radiation Oncology | Admitting: Radiation Oncology

## 2024-05-03 ENCOUNTER — Encounter: Payer: Self-pay | Admitting: Radiation Oncology

## 2024-05-03 VITALS — BP 134/86 | HR 74 | Temp 98.4°F | Resp 16 | Wt 180.0 lb

## 2024-05-03 DIAGNOSIS — Z9221 Personal history of antineoplastic chemotherapy: Secondary | ICD-10-CM | POA: Diagnosis not present

## 2024-05-03 DIAGNOSIS — Z85818 Personal history of malignant neoplasm of other sites of lip, oral cavity, and pharynx: Secondary | ICD-10-CM | POA: Insufficient documentation

## 2024-05-03 DIAGNOSIS — Z923 Personal history of irradiation: Secondary | ICD-10-CM | POA: Diagnosis not present

## 2024-05-03 DIAGNOSIS — C109 Malignant neoplasm of oropharynx, unspecified: Secondary | ICD-10-CM

## 2024-05-03 NOTE — Progress Notes (Signed)
 Radiation Oncology Follow up Note  Name: Carl Waters   Date:   05/03/2024 MRN:  [id number] DOB: [birth year]    This 68 y.o. male presents to the clinic today for 69-month follow-up status post concurrent chemoradiation therapy for stage III locally advanced squamous cell carcinoma left tonsil.  Patient's stage was cT2 N1 M0  REFERRING PROVIDER: Inc, [employer]  HPI: Patient is a 69 year old male now out 10 months having completed concurrent chemoradiation therapy for stage III p16 positive squamous cell carcinoma of the left tonsil.  Seen today in routine follow-up he is doing well.  He specifically denies any dysphagia any head and neck pain.  Taste is returned fairly much to normal..  Patient had a PET CT scan back in May which I have reviewed showing no evidence of disease.  He is also according to patient having upper endoscopies performed at [facility] although I do not see any documentation of those appointments.  He states he is visiting [facility] approximate every 3 months.  COMPLICATIONS OF TREATMENT: none  FOLLOW UP COMPLIANCE: keeps appointments   PHYSICAL EXAM:  BP 134/86   Pulse 74   Temp 98.4 F (36.9 C)   Resp 16   Wt 180 lb (81.6 kg)   BMI 24.41 kg/m  Oral cavity is clear no oral mucosal lesions are identified.  Neck is clear without evidence of cervical or supraclavicular adenopathy.  Well-developed well-nourished patient in NAD. HEENT reveals PERLA, EOMI, discs not visualized.  Oral cavity is clear. No oral mucosal lesions are identified. Neck is clear without evidence of cervical or supraclavicular adenopathy. Lungs are clear to A&P. Cardiac examination is essentially unremarkable with regular rate and rhythm without murmur rub or thrill. Abdomen is benign with no organomegaly or masses noted. Motor sensory and DTR levels are equal and symmetric in the upper and lower extremities. Cranial nerves II through XII are grossly intact. Proprioception is intact. No peripheral  adenopathy or edema is identified. No motor or sensory levels are noted. Crude visual fields are within normal range.  RADIOLOGY RESULTS: PET CT scan reviewed compatible with above-stated findings  PLAN: Present time patient continues to do well now out 10 months from concurrent chemoradiation therapy with no evidence of disease.  Patient continues close follow-up care and scoping at [facility].  I will turn follow-up care over to them.  I be happy to reevaluate him at any time should that be indicated.  Patient knows to call with any concerns at any time.  I would like to take this opportunity to thank you for allowing me to participate in the care of your patient.SABRA Marcey Penton, MD

## 2024-05-26 ENCOUNTER — Other Ambulatory Visit: Payer: Self-pay

## 2024-07-26 ENCOUNTER — Ambulatory Visit: Admitting: Oncology

## 2024-07-26 ENCOUNTER — Other Ambulatory Visit
